# Patient Record
Sex: Male | Born: 1955 | Race: White | Hispanic: No | Marital: Single | Smoking: Never smoker
Health system: Southern US, Community
[De-identification: ages and names within clinical notes are randomized; demographics above are authoritative.]

## PROBLEM LIST (undated history)

## (undated) DIAGNOSIS — I10 Essential (primary) hypertension: Secondary | ICD-10-CM

## (undated) DIAGNOSIS — I4891 Unspecified atrial fibrillation: Secondary | ICD-10-CM

## (undated) DIAGNOSIS — E119 Type 2 diabetes mellitus without complications: Secondary | ICD-10-CM

## (undated) HISTORY — PX: NO PAST SURGERIES: SHX2092

---

## 2011-07-13 ENCOUNTER — Emergency Department: Payer: Self-pay | Admitting: General Practice

## 2013-12-04 ENCOUNTER — Other Ambulatory Visit: Payer: Self-pay | Admitting: Family Medicine

## 2013-12-04 LAB — COMPREHENSIVE METABOLIC PANEL
ALBUMIN: 4.4 g/dL (ref 3.4–5.0)
AST: 19 U/L (ref 15–37)
Alkaline Phosphatase: 80 U/L
Anion Gap: 11 (ref 7–16)
BILIRUBIN TOTAL: 0.7 mg/dL (ref 0.2–1.0)
BUN: 36 mg/dL — AB (ref 7–18)
CALCIUM: 10.5 mg/dL — AB (ref 8.5–10.1)
CO2: 26 mmol/L (ref 21–32)
Chloride: 96 mmol/L — ABNORMAL LOW (ref 98–107)
Creatinine: 1.7 mg/dL — ABNORMAL HIGH (ref 0.60–1.30)
EGFR (Non-African Amer.): 44 — ABNORMAL LOW
GFR CALC AF AMER: 51 — AB
Glucose: 220 mg/dL — ABNORMAL HIGH (ref 65–99)
Osmolality: 281 (ref 275–301)
Potassium: 3.9 mmol/L (ref 3.5–5.1)
SGPT (ALT): 33 U/L (ref 12–78)
SODIUM: 133 mmol/L — AB (ref 136–145)
Total Protein: 8.4 g/dL — ABNORMAL HIGH (ref 6.4–8.2)

## 2013-12-04 LAB — URINALYSIS, COMPLETE
BACTERIA: NONE SEEN
BLOOD: NEGATIVE
Bilirubin,UR: NEGATIVE
LEUKOCYTE ESTERASE: NEGATIVE
NITRITE: NEGATIVE
PH: 5 (ref 4.5–8.0)
Protein: NEGATIVE
RBC,UR: NONE SEEN /HPF (ref 0–5)
Specific Gravity: 1.026 (ref 1.003–1.030)
Squamous Epithelial: NONE SEEN

## 2013-12-04 LAB — MAGNESIUM: MAGNESIUM: 1.7 mg/dL — AB

## 2013-12-09 ENCOUNTER — Emergency Department: Payer: Self-pay | Admitting: Emergency Medicine

## 2013-12-09 LAB — URINALYSIS, COMPLETE
BACTERIA: NONE SEEN
BILIRUBIN, UR: NEGATIVE
Blood: NEGATIVE
Glucose,UR: >=500 mg/dL (ref 0–75)
LEUKOCYTE ESTERASE: NEGATIVE
Nitrite: NEGATIVE
PH: 5 (ref 4.5–8.0)
Protein: NEGATIVE
RBC,UR: NONE SEEN /HPF (ref 0–5)
Specific Gravity: 1.015 (ref 1.003–1.030)
Squamous Epithelial: NONE SEEN
WBC UR: 1 /HPF (ref 0–5)

## 2013-12-09 LAB — CBC
HCT: 39.9 % — ABNORMAL LOW (ref 40.0–52.0)
HGB: 13.3 g/dL (ref 13.0–18.0)
MCH: 29.2 pg (ref 26.0–34.0)
MCHC: 33.4 g/dL (ref 32.0–36.0)
MCV: 88 fL (ref 80–100)
PLATELETS: 240 10*3/uL (ref 150–440)
RBC: 4.56 10*6/uL (ref 4.40–5.90)
RDW: 13.6 % (ref 11.5–14.5)
WBC: 9.3 10*3/uL (ref 3.8–10.6)

## 2013-12-09 LAB — COMPREHENSIVE METABOLIC PANEL
ALBUMIN: 4.5 g/dL (ref 3.4–5.0)
ALT: 31 U/L (ref 12–78)
ANION GAP: 10 (ref 7–16)
Alkaline Phosphatase: 65 U/L
BUN: 47 mg/dL — AB (ref 7–18)
Bilirubin,Total: 0.4 mg/dL (ref 0.2–1.0)
CALCIUM: 9.3 mg/dL (ref 8.5–10.1)
Chloride: 101 mmol/L (ref 98–107)
Co2: 24 mmol/L (ref 21–32)
Creatinine: 1.81 mg/dL — ABNORMAL HIGH (ref 0.60–1.30)
EGFR (African American): 47 — ABNORMAL LOW
EGFR (Non-African Amer.): 41 — ABNORMAL LOW
GLUCOSE: 242 mg/dL — AB (ref 65–99)
Osmolality: 290 (ref 275–301)
POTASSIUM: 4 mmol/L (ref 3.5–5.1)
SGOT(AST): 20 U/L (ref 15–37)
Sodium: 135 mmol/L — ABNORMAL LOW (ref 136–145)
Total Protein: 7.9 g/dL (ref 6.4–8.2)

## 2013-12-10 LAB — BASIC METABOLIC PANEL
Anion Gap: 7 (ref 7–16)
BUN: 41 mg/dL — ABNORMAL HIGH (ref 7–18)
Calcium, Total: 8.3 mg/dL — ABNORMAL LOW (ref 8.5–10.1)
Chloride: 106 mmol/L (ref 98–107)
Co2: 22 mmol/L (ref 21–32)
Creatinine: 1.61 mg/dL — ABNORMAL HIGH (ref 0.60–1.30)
EGFR (African American): 54 — ABNORMAL LOW
GFR CALC NON AF AMER: 47 — AB
Glucose: 183 mg/dL — ABNORMAL HIGH (ref 65–99)
Osmolality: 285 (ref 275–301)
POTASSIUM: 4 mmol/L (ref 3.5–5.1)
Sodium: 135 mmol/L — ABNORMAL LOW (ref 136–145)

## 2013-12-17 ENCOUNTER — Emergency Department: Payer: Self-pay | Admitting: Emergency Medicine

## 2013-12-18 LAB — ETHANOL
ETHANOL %: 0.006 % (ref 0.000–0.080)
ETHANOL LVL: 6 mg/dL

## 2013-12-18 LAB — URINALYSIS, COMPLETE
Bacteria: NONE SEEN
Bilirubin,UR: NEGATIVE
Blood: NEGATIVE
Glucose,UR: >=500 mg/dL (ref 0–75)
Ketone: NEGATIVE
Leukocyte Esterase: NEGATIVE
NITRITE: NEGATIVE
Ph: 5 (ref 4.5–8.0)
Protein: NEGATIVE
Specific Gravity: 1.013 (ref 1.003–1.030)
Squamous Epithelial: NONE SEEN
WBC UR: <1 /HPF (ref 0–5)

## 2013-12-18 LAB — COMPREHENSIVE METABOLIC PANEL
ALBUMIN: 4.4 g/dL (ref 3.4–5.0)
ALT: 31 U/L (ref 12–78)
AST: 27 U/L (ref 15–37)
Alkaline Phosphatase: 59 U/L
Anion Gap: 10 (ref 7–16)
BILIRUBIN TOTAL: 0.5 mg/dL (ref 0.2–1.0)
BUN: 57 mg/dL — AB (ref 7–18)
CALCIUM: 9.5 mg/dL (ref 8.5–10.1)
Chloride: 99 mmol/L (ref 98–107)
Co2: 23 mmol/L (ref 21–32)
Creatinine: 2.12 mg/dL — ABNORMAL HIGH (ref 0.60–1.30)
EGFR (African American): 39 — ABNORMAL LOW
EGFR (Non-African Amer.): 34 — ABNORMAL LOW
GLUCOSE: 248 mg/dL — AB (ref 65–99)
OSMOLALITY: 289 (ref 275–301)
POTASSIUM: 3.8 mmol/L (ref 3.5–5.1)
Sodium: 132 mmol/L — ABNORMAL LOW (ref 136–145)
Total Protein: 8.1 g/dL (ref 6.4–8.2)

## 2013-12-18 LAB — CBC
HCT: 40.7 % (ref 40.0–52.0)
HGB: 13.6 g/dL (ref 13.0–18.0)
MCH: 30 pg (ref 26.0–34.0)
MCHC: 33.5 g/dL (ref 32.0–36.0)
MCV: 90 fL (ref 80–100)
Platelet: 247 10*3/uL (ref 150–440)
RBC: 4.54 10*6/uL (ref 4.40–5.90)
RDW: 14 % (ref 11.5–14.5)
WBC: 9 10*3/uL (ref 3.8–10.6)

## 2013-12-18 LAB — TROPONIN I: Troponin-I: <0.02 ng/mL

## 2013-12-21 ENCOUNTER — Ambulatory Visit: Payer: Self-pay | Admitting: Family Medicine

## 2013-12-30 ENCOUNTER — Ambulatory Visit: Payer: Self-pay | Admitting: Family Medicine

## 2014-01-18 ENCOUNTER — Ambulatory Visit: Payer: Self-pay | Admitting: Internal Medicine

## 2014-01-18 LAB — CBC CANCER CENTER
Basophil #: 0 x10 3/mm (ref 0.0–0.1)
Basophil %: 0.8 %
EOS ABS: 0.1 x10 3/mm (ref 0.0–0.7)
Eosinophil %: 1.5 %
HCT: 36.6 % — AB (ref 40.0–52.0)
HGB: 11.9 g/dL — ABNORMAL LOW (ref 13.0–18.0)
LYMPHS ABS: 1.3 x10 3/mm (ref 1.0–3.6)
LYMPHS PCT: 22.5 %
MCH: 29.4 pg (ref 26.0–34.0)
MCHC: 32.5 g/dL (ref 32.0–36.0)
MCV: 91 fL (ref 80–100)
MONOS PCT: 8.7 %
Monocyte #: 0.5 x10 3/mm (ref 0.2–1.0)
NEUTROS PCT: 66.5 %
Neutrophil #: 3.9 x10 3/mm (ref 1.4–6.5)
Platelet: 220 x10 3/mm (ref 150–440)
RBC: 4.05 10*6/uL — ABNORMAL LOW (ref 4.40–5.90)
RDW: 14.8 % — AB (ref 11.5–14.5)
WBC: 5.9 x10 3/mm (ref 3.8–10.6)

## 2014-01-18 LAB — HEPATIC FUNCTION PANEL A (ARMC)
ALBUMIN: 4 g/dL (ref 3.4–5.0)
Alkaline Phosphatase: 54 U/L
Bilirubin, Direct: 0.1 mg/dL (ref 0.00–0.20)
Bilirubin,Total: 0.7 mg/dL (ref 0.2–1.0)
SGOT(AST): 19 U/L (ref 15–37)
SGPT (ALT): 33 U/L (ref 12–78)
TOTAL PROTEIN: 7.4 g/dL (ref 6.4–8.2)

## 2014-01-18 LAB — CREATININE, SERUM
CREATININE: 1.32 mg/dL — AB (ref 0.60–1.30)
EGFR (African American): >60
GFR CALC NON AF AMER: 59 — AB

## 2014-01-18 LAB — URIC ACID: Uric Acid: 6.1 mg/dL (ref 3.5–7.2)

## 2014-01-18 LAB — LACTATE DEHYDROGENASE: LDH: 195 U/L (ref 85–241)

## 2014-01-19 LAB — KAPPA/LAMBDA FREE LIGHT CHAINS (ARMC)

## 2014-01-30 ENCOUNTER — Ambulatory Visit: Payer: Self-pay | Admitting: Family Medicine

## 2014-01-30 ENCOUNTER — Ambulatory Visit: Payer: Self-pay | Admitting: Internal Medicine

## 2014-03-02 ENCOUNTER — Ambulatory Visit: Payer: Self-pay | Admitting: Family Medicine

## 2014-09-07 DIAGNOSIS — E119 Type 2 diabetes mellitus without complications: Secondary | ICD-10-CM | POA: Insufficient documentation

## 2019-11-19 ENCOUNTER — Ambulatory Visit: Payer: Self-pay | Admitting: Internal Medicine

## 2019-11-22 ENCOUNTER — Other Ambulatory Visit: Payer: Self-pay | Admitting: Internal Medicine

## 2019-11-27 ENCOUNTER — Telehealth: Payer: Self-pay | Admitting: Family Medicine

## 2019-11-27 ENCOUNTER — Other Ambulatory Visit: Payer: Self-pay | Admitting: Internal Medicine

## 2019-11-27 NOTE — Telephone Encounter (Signed)
Patient called and nurse had already finished it

## 2020-02-10 ENCOUNTER — Other Ambulatory Visit: Payer: Self-pay

## 2020-02-10 ENCOUNTER — Encounter: Payer: Self-pay | Admitting: Internal Medicine

## 2020-02-10 ENCOUNTER — Ambulatory Visit (INDEPENDENT_AMBULATORY_CARE_PROVIDER_SITE_OTHER): Payer: Self-pay | Admitting: Internal Medicine

## 2020-02-10 VITALS — BP 121/73 | HR 98 | Ht 73.0 in | Wt 255.2 lb

## 2020-02-10 DIAGNOSIS — E669 Obesity, unspecified: Secondary | ICD-10-CM

## 2020-02-10 DIAGNOSIS — E139 Other specified diabetes mellitus without complications: Secondary | ICD-10-CM

## 2020-02-10 DIAGNOSIS — I119 Hypertensive heart disease without heart failure: Secondary | ICD-10-CM

## 2020-02-10 DIAGNOSIS — I4891 Unspecified atrial fibrillation: Secondary | ICD-10-CM

## 2020-02-10 HISTORY — DX: Obesity, unspecified: E66.9

## 2020-02-10 LAB — POCT INR: INR: 2.6 (ref 2.0–3.0)

## 2020-02-10 MED ORDER — GLIPIZIDE ER 10 MG PO TB24: 10.0000 mg | ORAL_TABLET | Freq: Every day | ORAL | 3 refills | Status: DC

## 2020-02-10 MED ORDER — OLMESARTAN MEDOXOMIL 40 MG PO TABS: 40.0000 mg | ORAL_TABLET | Freq: Every day | ORAL | 2 refills | Status: DC

## 2020-02-10 MED ORDER — MUPIROCIN 2 % EX OINT: 1.0000 "application " | TOPICAL_OINTMENT | Freq: Two times a day (BID) | CUTANEOUS | 4 refills | Status: DC

## 2020-02-10 NOTE — Progress Notes (Signed)
Established Patient Office Visit  SUBJECTIVE:  Subjective  Patient ID: Jesse Humphrey, male    DOB: 07-05-1955  Age: 64 y.o. MRN: [id number]  CC:  Chief Complaint  Patient presents with  . Coumadin Management    Patient is here today for his 1 month INR check    HPI Jesse Humphrey is a 64 y.o. male presenting today for his 1 month INR check.  His INR today is 2.6.   He is not currently vaccinated against COVID-19. He is unsure about whether or not he wants to be vaccinated.   History reviewed. No pertinent past medical history.  History reviewed. No pertinent surgical history.  History reviewed. No pertinent family history.  Social History   Socioeconomic History  . Marital status: Single    Spouse name: Not on file  . Number of children: Not on file  . Years of education: Not on file  . Highest education level: Not on file  Occupational History  . Not on file  Tobacco Use  . Smoking status: Never Smoker  . Smokeless tobacco: Never Used  Substance and Sexual Activity  . Alcohol use: Never  . Drug use: Never  . Sexual activity: Not on file  Other Topics Concern  . Not on file  Social History Narrative  . Not on file   Social Determinants of Health   Financial Resource Strain:   . Difficulty of Paying Living Expenses:   Food Insecurity:   . Worried About Programme researcher, broadcasting/film/video in the Last Year:   . Barista in the Last Year:   Transportation Needs:   . Freight forwarder (Medical):   Marland Kitchen Lack of Transportation (Non-Medical):   Physical Activity:   . Days of Exercise per Week:   . Minutes of Exercise per Session:   Stress:   . Feeling of Stress :   Social Connections:   . Frequency of Communication with Friends and Family:   . Frequency of Social Gatherings with Friends and Family:   . Attends Religious Services:   . Active Member of Clubs or Organizations:   . Attends Banker Meetings:   Marland Kitchen Marital Status:   Intimate Partner  Violence:   . Fear of Current or Ex-Partner:   . Emotionally Abused:   Marland Kitchen Physically Abused:   . Sexually Abused:      Current Outpatient Medications:  .  cloNIDine (CATAPRES) 0.3 MG tablet, Take 0.3 mg by mouth 2 (two) times daily., Disp: , Rfl:  .  doxazosin (CARDURA) 4 MG tablet, TAKE ONE TABLET BY MOUTH DAILY, Disp: 30 tablet, Rfl: 4 .  furosemide (LASIX) 20 MG tablet, Take 20 mg by mouth., Disp: , Rfl:  .  glipiZIDE (GLUCOTROL XL) 10 MG 24 hr tablet, Take 1 tablet (10 mg total) by mouth daily., Disp: 90 tablet, Rfl: 3 .  loratadine (CLARITIN) 10 MG tablet, Take 10 mg by mouth daily., Disp: , Rfl:  .  mupirocin ointment (BACTROBAN) 2 %, Apply 1 application topically 2 (two) times daily., Disp: 22 g, Rfl: 4 .  olmesartan (BENICAR) 40 MG tablet, Take 1 tablet (40 mg total) by mouth daily., Disp: 90 tablet, Rfl: 2 .  pioglitazone (ACTOS) 45 MG tablet, Take 45 mg by mouth daily., Disp: , Rfl:  .  SOTALOL AF 80 MG TABS, TAKE FOUR TABLETS BY MOUTH DAILY, Disp: 120 tablet, Rfl: 4 .  warfarin (COUMADIN) 5 MG tablet, Take 5 mg by mouth daily., Disp: ,  Rfl:    No Known Allergies  ROS Review of Systems  Constitutional: Negative.   HENT: Negative.   Eyes: Negative.   Respiratory: Negative.   Cardiovascular: Negative.   Gastrointestinal: Negative.   Endocrine: Negative.   Genitourinary: Negative.   Musculoskeletal: Negative.   Skin: Negative.   Allergic/Immunologic: Negative.   Neurological: Negative.   Hematological: Negative.   Psychiatric/Behavioral: Negative.   All other systems reviewed and are negative.    OBJECTIVE:    Physical Exam Vitals reviewed.  Constitutional:      Appearance: Normal appearance.  HENT:     Mouth/Throat:     Mouth: Mucous membranes are moist.  Eyes:     Pupils: Pupils are equal, round, and reactive to light.  Neck:     Vascular: No carotid bruit.  Cardiovascular:     Rate and Rhythm: Normal rate and regular rhythm.     Pulses: Normal pulses.       Heart sounds: Normal heart sounds.  Pulmonary:     Effort: Pulmonary effort is normal.     Breath sounds: Normal breath sounds.  Abdominal:     General: Bowel sounds are normal.     Palpations: Abdomen is soft. There is no hepatomegaly, splenomegaly or mass.     Tenderness: There is no abdominal tenderness.     Hernia: No hernia is present.  Musculoskeletal:     Cervical back: Neck supple.     Right lower leg: No edema.     Left lower leg: No edema.  Skin:    Findings: No rash.  Neurological:     Mental Status: He is alert and oriented to person, place, and time.     Motor: No weakness.  Psychiatric:        Mood and Affect: Mood normal.        Behavior: Behavior normal.     BP 121/73   Pulse 98   Ht 6\' 1"  (1.854 m)   Wt 255 lb 3.2 oz (115.8 kg)   BMI 33.67 kg/m  Wt Readings from Last 3 Encounters:  02/10/20 255 lb 3.2 oz (115.8 kg)    Health Maintenance Due  Topic Date Due  . HEMOGLOBIN A1C  Never done  . Hepatitis C Screening  Never done  . PNEUMOCOCCAL POLYSACCHARIDE VACCINE AGE 76-64 HIGH RISK  Never done  . FOOT EXAM  Never done  . OPHTHALMOLOGY EXAM  Never done  . COVID-19 Vaccine (1) Never done  . HIV Screening  Never done  . TETANUS/TDAP  Never done  . COLONOSCOPY  Never done  . INFLUENZA VACCINE  01/31/2020    There are no preventive care reminders to display for this patient.  CBC Latest Ref Rng & Units 01/18/2014 12/18/2013 12/09/2013  WBC 3.8 - 10.6 x10 3/mm  5.9 9.0 9.3  Hemoglobin 13.0 - 18.0 g/dL 11.9(L) 13.6 13.3  Hematocrit 40.0 - 52.0 % 36.6(L) 40.7 39.9(L)  Platelets 150 - 440 x10 3/mm  220 247 240   CMP Latest Ref Rng & Units 01/18/2014 12/18/2013 12/10/2013  Glucose 65 - 99 mg/dL - 161(W248(H) 960(A183(H)  BUN 7 - 18 mg/dL - 54(U57(H) 98(J41(H)  Creatinine 0.60 - 1.30 mg/dL 1.91(Y1.32(H) 7.82(N2.12(H) 5.62(Z1.61(H)  Sodium 136 - 145 mmol/L - 132(L) 135(L)  Potassium 3.5 - 5.1 mmol/L - 3.8 4.0  Chloride 98 - 107 mmol/L - 99 106  CO2 21 - 32 mmol/L - 23 22  Calcium 8.5 -  10.1 mg/dL - 9.5 3.0(Q8.3(L)  Total Protein  6.4 - 8.2 g/dL 7.4 8.1 -  Total Bilirubin 0.2 - 1.0 mg/dL 0.7 0.5 -  Alkaline Phos Unit/L 54 59 -  AST 15 - 37 Unit/L 19 27 -  ALT 12 - 78 U/L 33 31 -    No results found for: TSH Lab Results  Component Value Date   ALBUMIN 4.0 01/18/2014   ANIONGAP 10 12/18/2013   No results found for: CHOL, HDL, LDLCALC, CHOLHDL No results found for: TRIG No results found for: HGBA1C    ASSESSMENT & PLAN:   Problem List Items Addressed This Visit      Cardiovascular and Mediastinum   Hypertensive heart disease    - Today, the patient's blood pressure is well managed on benicar. - The patient will continue the current treatment regimen.  - I encouraged the patient to eat a low-sodium diet to help control blood pressure. - I encouraged the patient to live an active lifestyle and complete activities that increases heart rate to 85% target heart rate at least 5 times per week for one hour.          Relevant Medications   warfarin (COUMADIN) 5 MG tablet   furosemide (LASIX) 20 MG tablet   cloNIDine (CATAPRES) 0.3 MG tablet   olmesartan (BENICAR) 40 MG tablet   Atrial fibrillation (HCC) - Primary    Patient pro time is therapeutic.  He is taking Coumadin 5 mg p.o. daily.  Also takes sotalol 80 mg 1 tablet twice a day.      Relevant Medications   warfarin (COUMADIN) 5 MG tablet   furosemide (LASIX) 20 MG tablet   cloNIDine (CATAPRES) 0.3 MG tablet   olmesartan (BENICAR) 40 MG tablet   Other Relevant Orders   POCT INR (Completed)     Endocrine   Diabetes 1.5, managed as type 2 (HCC)    Diabetes is being managed with pioglitazone he was advised to have a physical eye exam and colonoscopy.      Relevant Medications   pioglitazone (ACTOS) 45 MG tablet   glipiZIDE (GLUCOTROL XL) 10 MG 24 hr tablet   olmesartan (BENICAR) 40 MG tablet     Other   Obesity (BMI 30-39.9)    - I encouraged the patient to lose weight.  - I educated them on making  healthy dietary choices including eating more fruits and vegetables and less fried foods. - I encouraged the patient to exercise more, and educated on the benefits of exercise including weight loss, diabetes management, and hypertension management.        Relevant Medications   pioglitazone (ACTOS) 45 MG tablet   glipiZIDE (GLUCOTROL XL) 10 MG 24 hr tablet      Meds ordered this encounter  Medications  . mupirocin ointment (BACTROBAN) 2 %    Sig: Apply 1 application topically 2 (two) times daily.    Dispense:  22 g    Refill:  4  . glipiZIDE (GLUCOTROL XL) 10 MG 24 hr tablet    Sig: Take 1 tablet (10 mg total) by mouth daily.    Dispense:  90 tablet    Refill:  3  . olmesartan (BENICAR) 40 MG tablet    Sig: Take 1 tablet (40 mg total) by mouth daily.    Dispense:  90 tablet    Refill:  2    Follow-up: No follow-ups on file.    Dr. Woodroe Chen [facility] [address], [place], [place] [address]   By signing my  name below, I, [employer], attest that this documentation has been prepared under the direction and in the presence of Corky Downs, MD. Electronically Signed: Corky Downs, MD 02/10/20, 7:45 PM   I personally performed the services described in this documentation, which was SCRIBED in my presence. The recorded information has been reviewed and considered accurate. It has been edited as necessary during review. Corky Downs, MD

## 2020-02-10 NOTE — Assessment & Plan Note (Signed)
Diabetes is being managed with pioglitazone he was advised to have a physical eye exam and colonoscopy.

## 2020-02-10 NOTE — Assessment & Plan Note (Signed)
Patient pro time is therapeutic.  He is taking Coumadin 5 mg p.o. daily.  Also takes sotalol 80 mg 1 tablet twice a day.

## 2020-02-10 NOTE — Patient Instructions (Addendum)
COVID-19  COVID-19 is a respiratory infection that is caused by a virus called severe acute respiratory syndrome coronavirus 2 (SARS-CoV-2). The disease is also known as coronavirus disease or novel coronavirus. In some people, the virus may not cause any symptoms. In others, it may cause a serious infection. The infection can get worse quickly and can lead to complications, such as:  Pneumonia, or infection of the lungs.  Acute respiratory distress syndrome or ARDS. This is a condition in which fluid build-up in the lungs prevents the lungs from filling with air and passing oxygen into the blood.  Acute respiratory failure. This is a condition in which there is not enough oxygen passing from the lungs to the body or when carbon dioxide is not passing from the lungs out of the body.  Sepsis or septic shock. This is a serious bodily reaction to an infection.  Blood clotting problems.  Secondary infections due to bacteria or fungus.  Organ failure. This is when your body's organs stop working. The virus that causes COVID-19 is contagious. This means that it can spread from person to person through droplets from coughs and sneezes (respiratory secretions).  What are the causes? This illness is caused by a virus. You may catch the virus by:  Breathing in droplets from an infected person. Droplets can be spread by a person breathing, speaking, singing, coughing, or sneezing.  Touching something, like a table or a doorknob, that was exposed to the virus (contaminated) and then touching your mouth, nose, or eyes.  What increases the risk? Risk for infection You are more likely to be infected with this virus if you:  Are within 6 feet (2 meters) of a person with COVID-19.  Provide care for or live with a person who is infected with COVID-19.  Spend time in crowded indoor spaces or live in shared housing.  Risk for serious illness You are more likely to become seriously ill from the virus  if you: 1. Are 64 years of age or older. The higher your age, the more you are at risk for serious illness. 2. Live in a nursing home or long-term care facility. 3. Have cancer. 4. Have a long-term (chronic) disease such as: ? Chronic lung disease, including chronic obstructive pulmonary disease or asthma. ? A long-term disease that lowers your body's ability to fight infection (immunocompromised). ? Heart disease, including heart failure, a condition in which the arteries that lead to the heart become narrow or blocked (coronary artery disease), a disease which makes the heart muscle thick, weak, or stiff (cardiomyopathy). ? Diabetes. ? Chronic kidney disease. ? Sickle cell disease, a condition in which red blood cells have an abnormal "sickle" shape. ? Liver disease. 5. Are obese.  What are the signs or symptoms? Symptoms of this condition can range from mild to severe. Symptoms may appear any time from 2 to 14 days after being exposed to the virus. They include:  A fever or chills.  A cough.  Difficulty breathing.  Headaches, body aches, or muscle aches.  Runny or stuffy (congested) nose.  A sore throat.  New loss of taste or smell. Some people may also have stomach problems, such as nausea, vomiting, or diarrhea. Other people may not have any symptoms of COVID-19.  How is this diagnosed? This condition may be diagnosed based on: 1. Your signs and symptoms, especially if: ? You live in an area with a COVID-19 outbreak. ? You recently traveled to or from an area where  the virus is common. ? You provide care for or live with a person who was diagnosed with COVID-19. ? You were exposed to a person who was diagnosed with COVID-19. 2. A physical exam. 3. Lab tests, which may include: ? Taking a sample of fluid from the back of your nose and throat (nasopharyngeal fluid), your nose, or your throat using a swab. ? A sample of mucus from your lungs (sputum). ? Blood  tests. 4. Imaging tests, which may include, X-rays, CT scan, or ultrasound.  How is this treated? Your health care provider will talk with you about ways to treat your symptoms. For most people, the infection is mild and can be managed at home with rest, fluids, and over-the-counter medicines. Treatment for a serious infection usually takes places in a hospital intensive care unit (ICU). It may include one or more of the following treatments. These treatments are given until your symptoms improve.  Receiving fluids and medicines through an IV.  Supplemental oxygen. Extra oxygen is given through a tube in the nose, a face mask, or a hood.  Positioning you to lie on your stomach (prone position). This makes it easier for oxygen to get into the lungs.  Continuous positive airway pressure (CPAP) or bi-level positive airway pressure (BPAP) machine. This treatment uses mild air pressure to keep the airways open. A tube that is connected to a motor delivers oxygen to the body.  Ventilator. This treatment moves air into and out of the lungs by using a tube that is placed in your windpipe.  Tracheostomy. This is a procedure to create a hole in the neck so that a breathing tube can be inserted.  Extracorporeal membrane oxygenation (ECMO). This procedure gives the lungs a chance to recover by taking over the functions of the heart and lungs. It supplies oxygen to the body and removes carbon dioxide.  Follow these instructions at home: Lifestyle  If you are sick, stay home except to get medical care. Your health care provider will tell you how long to stay home. Call your health care provider before you go for medical care.  Rest at home as told by your health care provider.  Do not use any products that contain nicotine or tobacco, such as cigarettes, e-cigarettes, and chewing tobacco. If you need help quitting, ask your health care provider.  Return to your normal activities as told by your  health care provider. Ask your health care provider what activities are safe for you.  General instructions  Take over-the-counter and prescription medicines only as told by your health care provider.  Drink enough fluid to keep your urine pale yellow.  Keep all follow-up visits as told by your health care provider. This is important.  How is this prevented?   To protect yourself:  1. Do not travel to areas where COVID-19 is a risk. The areas where COVID-19 is reported change often. To identify high-risk areas and travel restrictions, check the CDC travel website: StageSync.si 2. If you live in, or must travel to, an area where COVID-19 is a risk, take precautions to avoid infection. 1. Stay away from people who are sick. 2. Wash your hands often with soap and water for 20 seconds. If soap and water are not available, use an alcohol-based hand sanitizer. 3. Avoid touching your mouth, face, eyes, or nose. 4. Avoid going out in public, follow guidance from your state and local health authorities. 5. If you must go out in public, wear a  cloth face covering or face mask. Make sure your mask covers your nose and mouth. 6. Avoid crowded indoor spaces. Stay at least 6 feet (2 meters) away from others. 7. Disinfect objects and surfaces that are frequently touched every day. This may include:  Counters and tables.  Doorknobs and light switches.  Sinks and faucets.  Electronics, such as phones, remote controls, keyboards, computers, and tablets.  To protect others: If you have symptoms of COVID-19, take steps to prevent the virus from spreading to others.  If you think you have a COVID-19 infection, contact your health care provider right away. Tell your health care team that you think you may have a COVID-19 infection.  Stay home. Leave your house only to seek medical care. Do not use public transport.  Do not travel while you are sick.  Wash your hands often with soap  and water for 20 seconds. If soap and water are not available, use alcohol-based hand sanitizer.  Stay away from other members of your household. Let healthy household members care for children and pets, if possible. If you have to care for children or pets, wash your hands often and wear a mask. If possible, stay in your own room, separate from others. Use a different bathroom.  Make sure that all people in your household wash their hands well and often.  Cough or sneeze into a tissue or your sleeve or elbow. Do not cough or sneeze into your hand or into the air.  Wear a cloth face covering or face mask. Make sure your mask covers your nose and mouth.  Where to find more information  Centers for Disease Control and Prevention: StickerEmporium.tn  World Health Organization: https://thompson-craig.com/  Contact a health care provider if:  You live in or have traveled to an area where COVID-19 is a risk and you have symptoms of the infection.  You have had contact with someone who has COVID-19 and you have symptoms of the infection.  Get help right away if:  You have trouble breathing.  You have pain or pressure in your chest.  You have confusion.  You have bluish lips and fingernails.  You have difficulty waking from sleep.  You have symptoms that get worse.  These symptoms may represent a serious problem that is an emergency. Do not wait to see if the symptoms will go away. Get medical help right away. Call your local emergency services (911 in the [place] Do not drive yourself to the hospital. Let the emergency medical personnel know if you think you have COVID-19. Summary  COVID-19 is a respiratory infection that is caused by a virus. It is also known as coronavirus disease or novel coronavirus. It can cause serious infections, such as pneumonia, acute respiratory distress syndrome, acute respiratory failure, or sepsis.  The virus that  causes COVID-19 is contagious. This means that it can spread from person to person through droplets from breathing, speaking, singing, coughing, or sneezing.  You are more likely to develop a serious illness if you are 48 years of age or older, have a weak immune system, live in a nursing home, or have chronic disease.  There is no medicine to treat COVID-19. Your health care provider will talk with you about ways to treat your symptoms.  Take steps to protect yourself and others from infection. Wash your hands often and disinfect objects and surfaces that are frequently touched every day. Stay away from people who are sick and wear a mask if  you are sick.  This information is not intended to replace advice given to you by your health care provider. Make sure you discuss any questions you have with your health care provider.  Document Revised: 04/17/2019 Document Reviewed: 07/24/2018 Elsevier Patient Education  2020 [employer].

## 2020-02-10 NOTE — Assessment & Plan Note (Signed)
-   Today, the patient's blood pressure is well managed on benicar. - The patient will continue the current treatment regimen.  - I encouraged the patient to eat a low-sodium diet to help control blood pressure. - I encouraged the patient to live an active lifestyle and complete activities that increases heart rate to 85% target heart rate at least 5 times per week for one hour.     

## 2020-02-10 NOTE — Assessment & Plan Note (Signed)
-   I encouraged the patient to lose weight.  - I educated them on making healthy dietary choices including eating more fruits and vegetables and less fried foods. - I encouraged the patient to exercise more, and educated on the benefits of exercise including weight loss, diabetes management, and hypertension management.   

## 2020-02-29 ENCOUNTER — Other Ambulatory Visit: Payer: Self-pay | Admitting: *Deleted

## 2020-02-29 MED ORDER — DOXAZOSIN MESYLATE 4 MG PO TABS: 4.0000 mg | ORAL_TABLET | Freq: Every day | ORAL | 4 refills | Status: DC

## 2020-03-09 ENCOUNTER — Other Ambulatory Visit: Payer: Self-pay

## 2020-03-09 MED ORDER — GLIPIZIDE ER 10 MG PO TB24
10.0000 mg | ORAL_TABLET | Freq: Every day | ORAL | 3 refills | Status: DC
Start: 2020-03-09 — End: 2021-03-07

## 2020-04-29 ENCOUNTER — Other Ambulatory Visit: Payer: Self-pay | Admitting: Internal Medicine

## 2020-05-02 ENCOUNTER — Other Ambulatory Visit: Payer: Self-pay | Admitting: *Deleted

## 2020-05-02 MED ORDER — SOTALOL HCL (AF) 80 MG PO TABS: 4.0000 | ORAL_TABLET | Freq: Every day | ORAL | 4 refills | Status: DC

## 2020-05-11 ENCOUNTER — Ambulatory Visit (INDEPENDENT_AMBULATORY_CARE_PROVIDER_SITE_OTHER): Payer: Self-pay | Admitting: Family Medicine

## 2020-05-11 ENCOUNTER — Encounter: Payer: Self-pay | Admitting: Family Medicine

## 2020-05-11 ENCOUNTER — Other Ambulatory Visit: Payer: Self-pay

## 2020-05-11 VITALS — BP 189/102 | HR 108 | Ht 72.0 in | Wt 265.5 lb

## 2020-05-11 DIAGNOSIS — E669 Obesity, unspecified: Secondary | ICD-10-CM

## 2020-05-11 DIAGNOSIS — I4891 Unspecified atrial fibrillation: Secondary | ICD-10-CM

## 2020-05-11 DIAGNOSIS — I119 Hypertensive heart disease without heart failure: Secondary | ICD-10-CM

## 2020-05-11 DIAGNOSIS — E139 Other specified diabetes mellitus without complications: Secondary | ICD-10-CM

## 2020-05-11 LAB — POCT INR: INR: 2.7 (ref 2.0–3.0)

## 2020-05-11 MED ORDER — PIOGLITAZONE HCL 45 MG PO TABS
45.0000 mg | ORAL_TABLET | Freq: Every day | ORAL | 3 refills | Status: DC
Start: 2020-05-11 — End: 2021-06-02

## 2020-05-11 MED ORDER — OLMESARTAN MEDOXOMIL 40 MG PO TABS
40.0000 mg | ORAL_TABLET | Freq: Every day | ORAL | 3 refills | Status: DC
Start: 2020-05-11 — End: 2021-06-02

## 2020-05-11 NOTE — Assessment & Plan Note (Signed)
HR wnl today, Regular Rate and Rhythm, taking Coumadin as rx, INR wnl.

## 2020-05-11 NOTE — Assessment & Plan Note (Signed)
Not at goal today, has not been under control for sometime, medication compliant and Jesse Humphrey wants to try and lose weight before we start any other medication. Will f/u in 3 months.

## 2020-05-11 NOTE — Assessment & Plan Note (Signed)
No at goal, discussed low carb diet and portion control with moderate exercise.

## 2020-05-11 NOTE — Assessment & Plan Note (Signed)
DM well controlled all meds being taken as rx. Will fu with labs in 3 months.

## 2020-05-11 NOTE — Progress Notes (Signed)
Established Patient Office Visit  SUBJECTIVE:  Subjective  Patient ID: Jesse Humphrey, male    DOB: [birth year]  Age: 64 y.o. MRN: [id number]  CC:  Chief Complaint  Patient presents with  . Atrial Fibrillation    HPI Jesse Humphrey is a 64 y.o. male presenting today for HTN and DM f/u  History reviewed. No pertinent past medical history.  History reviewed. No pertinent surgical history.  History reviewed. No pertinent family history.  Social History   Socioeconomic History  . Marital status: Single    Spouse name: Not on file  . Number of children: Not on file  . Years of education: Not on file  . Highest education level: Not on file  Occupational History  . Not on file  Tobacco Use  . Smoking status: Never Smoker  . Smokeless tobacco: Never Used  Substance and Sexual Activity  . Alcohol use: Never  . Drug use: Never  . Sexual activity: Not on file  Other Topics Concern  . Not on file  Social History Narrative  . Not on file   Social Determinants of Health   Financial Resource Strain:   . Difficulty of Paying Living Expenses: Not on file  Food Insecurity:   . Worried About Programme researcher, broadcasting/film/video in the Last Year: Not on file  . Ran Out of Food in the Last Year: Not on file  Transportation Needs:   . Lack of Transportation (Medical): Not on file  . Lack of Transportation (Non-Medical): Not on file  Physical Activity:   . Days of Exercise per Week: Not on file  . Minutes of Exercise per Session: Not on file  Stress:   . Feeling of Stress : Not on file  Social Connections:   . Frequency of Communication with Friends and Family: Not on file  . Frequency of Social Gatherings with Friends and Family: Not on file  . Attends Religious Services: Not on file  . Active Member of Clubs or Organizations: Not on file  . Attends Banker Meetings: Not on file  . Marital Status: Not on file  Intimate Partner Violence:   . Fear of Current or Ex-Partner: Not  on file  . Emotionally Abused: Not on file  . Physically Abused: Not on file  . Sexually Abused: Not on file     Current Outpatient Medications:  .  cloNIDine (CATAPRES) 0.3 MG tablet, Take 0.3 mg by mouth 2 (two) times daily., Disp: , Rfl:  .  doxazosin (CARDURA) 4 MG tablet, Take 1 tablet (4 mg total) by mouth daily., Disp: 30 tablet, Rfl: 4 .  furosemide (LASIX) 20 MG tablet, Take 20 mg by mouth., Disp: , Rfl:  .  glipiZIDE (GLUCOTROL XL) 10 MG 24 hr tablet, Take 1 tablet (10 mg total) by mouth daily., Disp: 90 tablet, Rfl: 3 .  loratadine (CLARITIN) 10 MG tablet, Take 10 mg by mouth daily., Disp: , Rfl:  .  mupirocin ointment (BACTROBAN) 2 %, Apply 1 application topically 2 (two) times daily., Disp: 22 g, Rfl: 4 .  olmesartan (BENICAR) 40 MG tablet, Take 1 tablet (40 mg total) by mouth daily., Disp: 90 tablet, Rfl: 3 .  pioglitazone (ACTOS) 45 MG tablet, Take 1 tablet (45 mg total) by mouth daily., Disp: 90 tablet, Rfl: 3 .  sotalol (BETAPACE) 80 MG tablet, TAKE FOUR TABLETS BY MOUTH DAILY, Disp: 120 tablet, Rfl: 3 .  SOTALOL AF 80 MG TABS, Take 4 tablets (320 mg total) by  mouth daily., Disp: 120 tablet, Rfl: 4 .  warfarin (COUMADIN) 5 MG tablet, Take 5 mg by mouth daily., Disp: , Rfl:    No Known Allergies  ROS Review of Systems   OBJECTIVE:    Physical Exam  BP (!) 189/102   Pulse (!) 108   Ht 6' (1.829 m)   Wt 265 lb 8 oz (120.4 kg)   BMI 36.01 kg/m  Wt Readings from Last 3 Encounters:  05/11/20 265 lb 8 oz (120.4 kg)  02/10/20 255 lb 3.2 oz (115.8 kg)    Health Maintenance Due  Topic Date Due  . HEMOGLOBIN A1C  Never done  . Hepatitis C Screening  Never done  . PNEUMOCOCCAL POLYSACCHARIDE VACCINE AGE 83-64 HIGH RISK  Never done  . FOOT EXAM  Never done  . OPHTHALMOLOGY EXAM  Never done  . COVID-19 Vaccine (1) Never done  . HIV Screening  Never done  . TETANUS/TDAP  Never done  . COLONOSCOPY  Never done  . INFLUENZA VACCINE  Never done    There are no  preventive care reminders to display for this patient.  CBC Latest Ref Rng & Units 01/18/2014 12/18/2013 12/09/2013  WBC 3.8 - 10.6 x10 3/mm  5.9 9.0 9.3  Hemoglobin 13.0 - 18.0 g/dL 11.9(L) 13.6 13.3  Hematocrit 40.0 - 52.0 % 36.6(L) 40.7 39.9(L)  Platelets 150 - 440 x10 3/mm  220 247 240   CMP Latest Ref Rng & Units 01/18/2014 12/18/2013 12/10/2013  Glucose 65 - 99 mg/dL - 993(Z) 169(C)  BUN 7 - 18 mg/dL - 78(L) 38(B)  Creatinine 0.60 - 1.30 mg/dL 0.17(P) 1.02(H) 8.52(D)  Sodium 136 - 145 mmol/L - 132(L) 135(L)  Potassium 3.5 - 5.1 mmol/L - 3.8 4.0  Chloride 98 - 107 mmol/L - 99 106  CO2 21 - 32 mmol/L - 23 22  Calcium 8.5 - 10.1 mg/dL - 9.5 7.8(E)  Total Protein 6.4 - 8.2 g/dL 7.4 8.1 -  Total Bilirubin 0.2 - 1.0 mg/dL 0.7 0.5 -  Alkaline Phos Unit/L 54 59 -  AST 15 - 37 Unit/L 19 27 -  ALT 12 - 78 U/L 33 31 -    No results found for: TSH Lab Results  Component Value Date   ALBUMIN 4.0 01/18/2014   ANIONGAP 10 12/18/2013   No results found for: CHOL, HDL, LDLCALC, CHOLHDL No results found for: TRIG No results found for: HGBA1C    ASSESSMENT & PLAN:   Problem List Items Addressed This Visit      Cardiovascular and Mediastinum   Hypertensive heart disease    Not at goal today, has not been under control for sometime, medication compliant and Mr Alred wants to try and lose weight before we start any other medication. Will f/u in 3 months.       Relevant Medications   olmesartan (BENICAR) 40 MG tablet   Atrial fibrillation (HCC) - Primary    HR wnl today, Regular Rate and Rhythm, taking Coumadin as rx, INR wnl.      Relevant Medications   olmesartan (BENICAR) 40 MG tablet   Other Relevant Orders   POCT INR (Completed)     Endocrine   Diabetes 1.5, managed as type 2 (HCC)    DM well controlled all meds being taken as rx. Will fu with labs in 3 months.       Relevant Medications   pioglitazone (ACTOS) 45 MG tablet   olmesartan (BENICAR) 40 MG tablet     Other  Obesity (BMI 30-39.9)    No at goal, discussed low carb diet and portion control with moderate exercise.       Relevant Medications   pioglitazone (ACTOS) 45 MG tablet      Meds ordered this encounter  Medications  . pioglitazone (ACTOS) 45 MG tablet    Sig: Take 1 tablet (45 mg total) by mouth daily.    Dispense:  90 tablet    Refill:  3  . olmesartan (BENICAR) 40 MG tablet    Sig: Take 1 tablet (40 mg total) by mouth daily.    Dispense:  90 tablet    Refill:  3      Follow-up: No follow-ups on file.    Irish Lack, FNP [facility] [address], [place], [place] [address]

## 2020-07-19 ENCOUNTER — Ambulatory Visit (INDEPENDENT_AMBULATORY_CARE_PROVIDER_SITE_OTHER): Payer: Self-pay | Admitting: Internal Medicine

## 2020-07-19 ENCOUNTER — Other Ambulatory Visit: Payer: Self-pay

## 2020-07-19 DIAGNOSIS — E1169 Type 2 diabetes mellitus with other specified complication: Secondary | ICD-10-CM

## 2020-07-19 NOTE — Progress Notes (Signed)
po

## 2020-07-20 ENCOUNTER — Other Ambulatory Visit (INDEPENDENT_AMBULATORY_CARE_PROVIDER_SITE_OTHER): Payer: Self-pay

## 2020-07-20 ENCOUNTER — Other Ambulatory Visit: Payer: Self-pay

## 2020-07-20 DIAGNOSIS — E669 Obesity, unspecified: Secondary | ICD-10-CM

## 2020-07-21 LAB — CBC WITH DIFFERENTIAL/PLATELET
Absolute Monocytes: 340 cells/uL (ref 200–950)
Basophils Absolute: 39 cells/uL (ref 0–200)
Basophils Relative: 0.9 %
Eosinophils Absolute: 90 cells/uL (ref 15–500)
Eosinophils Relative: 2.1 %
HCT: 38.2 % — ABNORMAL LOW (ref 38.5–50.0)
Hemoglobin: 13.1 g/dL — ABNORMAL LOW (ref 13.2–17.1)
Lymphs Abs: 1028 cells/uL (ref 850–3900)
MCH: 30.1 pg (ref 27.0–33.0)
MCHC: 34.3 g/dL (ref 32.0–36.0)
MCV: 87.8 fL (ref 80.0–100.0)
MPV: 11.3 fL (ref 7.5–12.5)
Monocytes Relative: 7.9 %
Neutro Abs: 2804 cells/uL (ref 1500–7800)
Neutrophils Relative %: 65.2 %
Platelets: 168 10*3/uL (ref 140–400)
RBC: 4.35 10*6/uL (ref 4.20–5.80)
RDW: 13.2 % (ref 11.0–15.0)
Total Lymphocyte: 23.9 %
WBC: 4.3 10*3/uL (ref 3.8–10.8)

## 2020-07-21 LAB — COMPLETE METABOLIC PANEL WITH GFR
AG Ratio: 1.5 (calc) (ref 1.0–2.5)
ALT: 13 U/L (ref 9–46)
AST: 18 U/L (ref 10–35)
Albumin: 4.4 g/dL (ref 3.6–5.1)
Alkaline phosphatase (APISO): 77 U/L (ref 35–144)
BUN/Creatinine Ratio: 7 (calc) (ref 6–22)
BUN: 10 mg/dL (ref 7–25)
CO2: 28 mmol/L (ref 20–32)
Calcium: 9.6 mg/dL (ref 8.6–10.3)
Chloride: 100 mmol/L (ref 98–110)
Creat: 1.48 mg/dL — ABNORMAL HIGH (ref 0.70–1.25)
GFR, Est African American: 57 mL/min/{1.73_m2} — ABNORMAL LOW (ref >=60)
GFR, Est Non African American: 49 mL/min/{1.73_m2} — ABNORMAL LOW (ref >=60)
Globulin: 2.9 g/dL (calc) (ref 1.9–3.7)
Glucose, Bld: 203 mg/dL — ABNORMAL HIGH (ref 65–99)
Potassium: 4.1 mmol/L (ref 3.5–5.3)
Sodium: 138 mmol/L (ref 135–146)
Total Bilirubin: 1.4 mg/dL — ABNORMAL HIGH (ref 0.2–1.2)
Total Protein: 7.3 g/dL (ref 6.1–8.1)

## 2020-07-21 LAB — LIPID PANEL
Cholesterol: 220 mg/dL — ABNORMAL HIGH (ref <200)
HDL: 35 mg/dL — ABNORMAL LOW (ref >=40)
LDL Cholesterol (Calc): 161 mg/dL (calc) — ABNORMAL HIGH
Non-HDL Cholesterol (Calc): 185 mg/dL (calc) — ABNORMAL HIGH (ref <130)
Total CHOL/HDL Ratio: 6.3 (calc) — ABNORMAL HIGH (ref <5.0)
Triglycerides: 122 mg/dL (ref <150)

## 2020-07-21 LAB — TSH: TSH: 2.97 mIU/L (ref 0.40–4.50)

## 2020-07-22 ENCOUNTER — Encounter: Payer: Self-pay | Admitting: Family Medicine

## 2020-07-22 ENCOUNTER — Other Ambulatory Visit: Payer: Self-pay

## 2020-07-22 ENCOUNTER — Ambulatory Visit (INDEPENDENT_AMBULATORY_CARE_PROVIDER_SITE_OTHER): Payer: Self-pay | Admitting: Family Medicine

## 2020-07-22 VITALS — BP 144/75 | HR 56 | Ht 72.0 in | Wt 253.1 lb

## 2020-07-22 DIAGNOSIS — I119 Hypertensive heart disease without heart failure: Secondary | ICD-10-CM

## 2020-07-22 DIAGNOSIS — E139 Other specified diabetes mellitus without complications: Secondary | ICD-10-CM

## 2020-07-22 DIAGNOSIS — E669 Obesity, unspecified: Secondary | ICD-10-CM

## 2020-07-22 NOTE — Progress Notes (Signed)
Established Patient Office Visit  SUBJECTIVE:  Subjective  Patient ID: Jesse Humphrey, male    DOB: April 29, 1956  Age: 65 y.o. MRN: [id number]  CC:  Chief Complaint  Patient presents with  . Lab Results    HPI Jesse Humphrey is a 65 y.o. male presenting today for     History reviewed. No pertinent past medical history.  History reviewed. No pertinent surgical history.  History reviewed. No pertinent family history.  Social History   Socioeconomic History  . Marital status: Single    Spouse name: Not on file  . Number of children: Not on file  . Years of education: Not on file  . Highest education level: Not on file  Occupational History  . Not on file  Tobacco Use  . Smoking status: Never Smoker  . Smokeless tobacco: Never Used  Substance and Sexual Activity  . Alcohol use: Never  . Drug use: Never  . Sexual activity: Not on file  Other Topics Concern  . Not on file  Social History Narrative  . Not on file   Social Determinants of Health   Financial Resource Strain: Not on file  Food Insecurity: Not on file  Transportation Needs: Not on file  Physical Activity: Not on file  Stress: Not on file  Social Connections: Not on file  Intimate Partner Violence: Not on file     Current Outpatient Medications:  .  cloNIDine (CATAPRES) 0.3 MG tablet, Take 0.3 mg by mouth 2 (two) times daily., Disp: , Rfl:  .  doxazosin (CARDURA) 4 MG tablet, Take 1 tablet (4 mg total) by mouth daily., Disp: 30 tablet, Rfl: 4 .  furosemide (LASIX) 20 MG tablet, Take 20 mg by mouth., Disp: , Rfl:  .  glipiZIDE (GLUCOTROL XL) 10 MG 24 hr tablet, Take 1 tablet (10 mg total) by mouth daily., Disp: 90 tablet, Rfl: 3 .  loratadine (CLARITIN) 10 MG tablet, Take 10 mg by mouth daily., Disp: , Rfl:  .  mupirocin ointment (BACTROBAN) 2 %, Apply 1 application topically 2 (two) times daily., Disp: 22 g, Rfl: 4 .  olmesartan (BENICAR) 40 MG tablet, Take 1 tablet (40 mg total) by mouth daily.,  Disp: 90 tablet, Rfl: 3 .  pioglitazone (ACTOS) 45 MG tablet, Take 1 tablet (45 mg total) by mouth daily., Disp: 90 tablet, Rfl: 3 .  sotalol (BETAPACE) 80 MG tablet, TAKE FOUR TABLETS BY MOUTH DAILY, Disp: 120 tablet, Rfl: 3 .  SOTALOL AF 80 MG TABS, Take 4 tablets (320 mg total) by mouth daily., Disp: 120 tablet, Rfl: 4 .  warfarin (COUMADIN) 5 MG tablet, Take 5 mg by mouth daily., Disp: , Rfl:    No Known Allergies  ROS Review of Systems  Constitutional: Negative.   HENT: Negative.   Respiratory: Negative.   Cardiovascular: Negative.   Genitourinary: Negative.   Musculoskeletal: Negative.   Neurological: Negative.   Psychiatric/Behavioral: Negative.      OBJECTIVE:    Physical Exam Vitals and nursing note reviewed.  HENT:     Head: Normocephalic.     Mouth/Throat:     Mouth: Mucous membranes are moist.  Eyes:     Pupils: Pupils are equal, round, and reactive to light.  Cardiovascular:     Rate and Rhythm: Normal rate and regular rhythm.  Musculoskeletal:     Cervical back: Normal range of motion.  Neurological:     Mental Status: He is alert.  Psychiatric:  Mood and Affect: Mood normal.     BP (!) 144/75   Pulse (!) 56   Ht 6' (1.829 m)   Wt 253 lb 1.6 oz (114.8 kg)   SpO2 98%   BMI 34.33 kg/m  Wt Readings from Last 3 Encounters:  07/22/20 253 lb 1.6 oz (114.8 kg)  05/11/20 265 lb 8 oz (120.4 kg)  02/10/20 255 lb 3.2 oz (115.8 kg)    Health Maintenance Due  Topic Date Due  . Hepatitis C Screening  Never done  . COVID-19 Vaccine (1) Never done  . FOOT EXAM  Never done  . OPHTHALMOLOGY EXAM  Never done  . HIV Screening  Never done  . TETANUS/TDAP  Never done  . COLONOSCOPY (Pts 45-52yrs Insurance coverage will need to be confirmed)  Never done  . INFLUENZA VACCINE  Never done    There are no preventive care reminders to display for this patient.  CBC Latest Ref Rng & Units 07/20/2020 01/18/2014 12/18/2013  WBC 3.8 - 10.8 Thousand/uL 4.3 5.9 9.0   Hemoglobin 13.2 - 17.1 g/dL 13.1(L) 11.9(L) 13.6  Hematocrit 38.5 - 50.0 % 38.2(L) 36.6(L) 40.7  Platelets 140 - 400 Thousand/uL 168 220 247   CMP Latest Ref Rng & Units 07/20/2020 01/18/2014 12/18/2013  Glucose 65 - 99 mg/dL 902(I) - 097(D)  BUN 7 - 25 mg/dL 10 - 53(G)  Creatinine 0.70 - 1.25 mg/dL 9.92(E) 2.68(T) 4.19(Q)  Sodium 135 - 146 mmol/L 138 - 132(L)  Potassium 3.5 - 5.3 mmol/L 4.1 - 3.8  Chloride 98 - 110 mmol/L 100 - 99  CO2 20 - 32 mmol/L 28 - 23  Calcium 8.6 - 10.3 mg/dL 9.6 - 9.5  Total Protein 6.1 - 8.1 g/dL 7.3 7.4 8.1  Total Bilirubin 0.2 - 1.2 mg/dL 2.2(W) 0.7 0.5  Alkaline Phos Unit/L - 54 59  AST 10 - 35 U/L 18 19 27   ALT 9 - 46 U/L 13 33 31    Lab Results  Component Value Date   TSH 2.97 07/20/2020   Lab Results  Component Value Date   ALBUMIN 4.0 01/18/2014   ANIONGAP 10 12/18/2013   Lab Results  Component Value Date   CHOL 220 (H) 07/20/2020   HDL 35 (L) 07/20/2020   LDLCALC 161 (H) 07/20/2020   CHOLHDL 6.3 (H) 07/20/2020   Lab Results  Component Value Date   TRIG 122 07/20/2020   No results found for: HGBA1C    ASSESSMENT & PLAN:   Problem List Items Addressed This Visit      Cardiovascular and Mediastinum   Hypertensive heart disease - Primary    Patient's blood pressure is within the desired range. Medication side effects include: fatigue Continue current treatment regimen. Continue current medications.  Patient is going to watch sodium intake.          Endocrine   Diabetes 1.5, managed as type 2 (HCC)    Diabetes mellitus Type II, under excellent control.. Discussed general issues about diabetes pathophysiology and management.         Other   Obesity (BMI 30-39.9)    Watching carbs, he travels a lot. He will stay away from fast food as much.          No orders of the defined types were placed in this encounter.     Follow-up: No follow-ups on file.    07/22/2020, FNP [facility] [address] [place], [place]

## 2020-07-22 NOTE — Assessment & Plan Note (Signed)
Patient's blood pressure is within the desired range. Medication side effects include: fatigue Continue current treatment regimen. Continue current medications.  Patient is going to watch sodium intake.

## 2020-07-22 NOTE — Assessment & Plan Note (Signed)
Watching carbs, he travels a lot. He will stay away from fast food as much.

## 2020-07-22 NOTE — Assessment & Plan Note (Signed)
Diabetes mellitus Type II, under excellent control.. Discussed general issues about diabetes pathophysiology and management.  

## 2020-08-05 ENCOUNTER — Other Ambulatory Visit: Payer: Self-pay | Admitting: *Deleted

## 2020-08-05 MED ORDER — CLONIDINE HCL 0.2 MG PO TABS: 0.2000 mg | ORAL_TABLET | Freq: Two times a day (BID) | ORAL | 4 refills | Status: DC

## 2020-08-05 MED ORDER — AZITHROMYCIN 250 MG PO TABS: ORAL_TABLET | ORAL | 0 refills | Status: DC

## 2020-08-10 ENCOUNTER — Ambulatory Visit: Payer: Self-pay | Admitting: Internal Medicine

## 2020-08-12 ENCOUNTER — Other Ambulatory Visit: Payer: Self-pay

## 2020-08-12 ENCOUNTER — Emergency Department: Payer: Self-pay

## 2020-08-12 ENCOUNTER — Ambulatory Visit (INDEPENDENT_AMBULATORY_CARE_PROVIDER_SITE_OTHER): Payer: Self-pay | Admitting: Family Medicine

## 2020-08-12 ENCOUNTER — Encounter: Payer: Self-pay | Admitting: Family Medicine

## 2020-08-12 ENCOUNTER — Emergency Department
Admission: EM | Admit: 2020-08-12 | Discharge: 2020-08-12 | Disposition: A | Discharge status: Home / Self Care | Payer: Self-pay | Attending: Emergency Medicine | Admitting: Emergency Medicine

## 2020-08-12 ENCOUNTER — Encounter: Payer: Self-pay | Admitting: Emergency Medicine

## 2020-08-12 VITALS — BP 162/84 | HR 142

## 2020-08-12 DIAGNOSIS — U071 COVID-19: Secondary | ICD-10-CM | POA: Insufficient documentation

## 2020-08-12 DIAGNOSIS — Z79899 Other long term (current) drug therapy: Secondary | ICD-10-CM | POA: Insufficient documentation

## 2020-08-12 DIAGNOSIS — Z7901 Long term (current) use of anticoagulants: Secondary | ICD-10-CM | POA: Insufficient documentation

## 2020-08-12 DIAGNOSIS — I10 Essential (primary) hypertension: Secondary | ICD-10-CM | POA: Insufficient documentation

## 2020-08-12 DIAGNOSIS — I4891 Unspecified atrial fibrillation: Secondary | ICD-10-CM | POA: Insufficient documentation

## 2020-08-12 DIAGNOSIS — E119 Type 2 diabetes mellitus without complications: Secondary | ICD-10-CM | POA: Insufficient documentation

## 2020-08-12 HISTORY — DX: Type 2 diabetes mellitus without complications: E11.9

## 2020-08-12 HISTORY — DX: Essential (primary) hypertension: I10

## 2020-08-12 HISTORY — DX: Unspecified atrial fibrillation: I48.91

## 2020-08-12 LAB — CBC WITH DIFFERENTIAL/PLATELET
Abs Immature Granulocytes: 0.01 10*3/uL (ref 0.00–0.07)
Basophils Absolute: 0 10*3/uL (ref 0.0–0.1)
Basophils Relative: 0 %
Eosinophils Absolute: 0 10*3/uL (ref 0.0–0.5)
Eosinophils Relative: 0 %
HCT: 42.3 % (ref 39.0–52.0)
Hemoglobin: 14.4 g/dL (ref 13.0–17.0)
Immature Granulocytes: 0 %
Lymphocytes Relative: 15 %
Lymphs Abs: 1 10*3/uL (ref 0.7–4.0)
MCH: 29.3 pg (ref 26.0–34.0)
MCHC: 34 g/dL (ref 30.0–36.0)
MCV: 86.2 fL (ref 80.0–100.0)
Monocytes Absolute: 0.6 10*3/uL (ref 0.1–1.0)
Monocytes Relative: 9 %
Neutro Abs: 4.8 10*3/uL (ref 1.7–7.7)
Neutrophils Relative %: 76 %
Platelets: 158 10*3/uL (ref 150–400)
RBC: 4.91 MIL/uL (ref 4.22–5.81)
RDW: 14.2 % (ref 11.5–15.5)
WBC: 6.3 10*3/uL (ref 4.0–10.5)
nRBC: 0 % (ref 0.0–0.2)

## 2020-08-12 LAB — COMPREHENSIVE METABOLIC PANEL
ALT: 25 U/L (ref 0–44)
AST: 48 U/L — ABNORMAL HIGH (ref 15–41)
Albumin: 4.2 g/dL (ref 3.5–5.0)
Alkaline Phosphatase: 65 U/L (ref 38–126)
Anion gap: 12 (ref 5–15)
BUN: 19 mg/dL (ref 8–23)
CO2: 25 mmol/L (ref 22–32)
Calcium: 9.1 mg/dL (ref 8.9–10.3)
Chloride: 102 mmol/L (ref 98–111)
Creatinine, Ser: 1.12 mg/dL (ref 0.61–1.24)
GFR, Estimated: >60 mL/min (ref >60)
Glucose, Bld: 150 mg/dL — ABNORMAL HIGH (ref 70–99)
Potassium: 3.5 mmol/L (ref 3.5–5.1)
Sodium: 139 mmol/L (ref 135–145)
Total Bilirubin: 1.9 mg/dL — ABNORMAL HIGH (ref 0.3–1.2)
Total Protein: 7.8 g/dL (ref 6.5–8.1)

## 2020-08-12 LAB — TROPONIN I (HIGH SENSITIVITY)
Troponin I (High Sensitivity): 18 ng/L — ABNORMAL HIGH (ref <18)
Troponin I (High Sensitivity): 20 ng/L — ABNORMAL HIGH (ref <18)

## 2020-08-12 LAB — APTT: aPTT: 29 seconds (ref 24–36)

## 2020-08-12 LAB — BRAIN NATRIURETIC PEPTIDE: B Natriuretic Peptide: 605.6 pg/mL — ABNORMAL HIGH (ref 0.0–100.0)

## 2020-08-12 LAB — PROTIME-INR
INR: 1.2 (ref 0.8–1.2)
Prothrombin Time: 15.1 seconds (ref 11.4–15.2)

## 2020-08-12 LAB — POCT INR: INR: 2 (ref 2.0–3.0)

## 2020-08-12 LAB — MAGNESIUM: Magnesium: 1.9 mg/dL (ref 1.7–2.4)

## 2020-08-12 MED ORDER — DILTIAZEM HCL ER COATED BEADS 180 MG PO CP24: 180.0000 mg | ORAL_CAPSULE | Freq: Every day | ORAL | 0 refills | Status: DC

## 2020-08-12 MED ORDER — DILTIAZEM HCL 60 MG PO TABS
30.0000 mg | ORAL_TABLET | Freq: Once | ORAL | Status: AC
  Administered 2020-08-12: 30 mg via ORAL
  Filled 2020-08-12: qty 1

## 2020-08-12 MED ORDER — ASPIRIN 81 MG PO CHEW: 324.0000 mg | CHEWABLE_TABLET | Freq: Once | ORAL | Status: DC

## 2020-08-12 MED ORDER — DILTIAZEM HCL ER COATED BEADS 180 MG PO CP24
180.0000 mg | ORAL_CAPSULE | Freq: Once | ORAL | Status: AC
  Administered 2020-08-12: 180 mg via ORAL
  Filled 2020-08-12: qty 1

## 2020-08-12 MED ORDER — MAGNESIUM SULFATE 2 GM/50ML IV SOLN
2.0000 g | Freq: Once | INTRAVENOUS | Status: AC
  Administered 2020-08-12: 2 g via INTRAVENOUS
  Filled 2020-08-12: qty 50

## 2020-08-12 MED ORDER — DILTIAZEM HCL 25 MG/5ML IV SOLN
15.0000 mg | Freq: Once | INTRAVENOUS | Status: AC
  Administered 2020-08-12: 15 mg via INTRAVENOUS
  Filled 2020-08-12: qty 5

## 2020-08-12 MED ORDER — DILTIAZEM HCL 25 MG/5ML IV SOLN
10.0000 mg | Freq: Once | INTRAVENOUS | Status: AC
  Administered 2020-08-12: 10 mg via INTRAVENOUS
  Filled 2020-08-12: qty 5

## 2020-08-12 MED ORDER — POTASSIUM CHLORIDE CRYS ER 20 MEQ PO TBCR
40.0000 meq | EXTENDED_RELEASE_TABLET | Freq: Once | ORAL | Status: AC
  Administered 2020-08-12: 40 meq via ORAL
  Filled 2020-08-12: qty 2

## 2020-08-12 NOTE — ED Notes (Signed)
Radiology at bedside

## 2020-08-12 NOTE — Progress Notes (Signed)
Established Patient Office Visit  SUBJECTIVE:  Subjective  Patient ID: Jesse Humphrey, male    DOB: [birth year]  Age: 65 y.o. MRN: [id number]  CC:  Chief Complaint  Patient presents with  . Fatigue    Patient states that he has no energy     HPI Jesse Humphrey is a 65 y.o. male presenting today for     Past Medical History:  Diagnosis Date  . Atrial fibrillation (HCC)   . Diabetes mellitus without complication (HCC)   . Hypertension     History reviewed. No pertinent surgical history.  History reviewed. No pertinent family history.  Social History   Socioeconomic History  . Marital status: Single    Spouse name: Not on file  . Number of children: Not on file  . Years of education: Not on file  . Highest education level: Not on file  Occupational History  . Not on file  Tobacco Use  . Smoking status: Never Smoker  . Smokeless tobacco: Never Used  Substance and Sexual Activity  . Alcohol use: Never  . Drug use: Never  . Sexual activity: Not on file  Other Topics Concern  . Not on file  Social History Narrative  . Not on file   Social Determinants of Health   Financial Resource Strain: Not on file  Food Insecurity: Not on file  Transportation Needs: Not on file  Physical Activity: Not on file  Stress: Not on file  Social Connections: Not on file  Intimate Partner Violence: Not on file    No current facility-administered medications for this visit.  Current Outpatient Medications:  .  cloNIDine (CATAPRES) 0.2 MG tablet, Take 1 tablet (0.2 mg total) by mouth 2 (two) times daily., Disp: 60 tablet, Rfl: 4 .  doxazosin (CARDURA) 4 MG tablet, Take 1 tablet (4 mg total) by mouth daily., Disp: 30 tablet, Rfl: 4 .  furosemide (LASIX) 20 MG tablet, Take 20 mg by mouth., Disp: , Rfl:  .  glipiZIDE (GLUCOTROL XL) 10 MG 24 hr tablet, Take 1 tablet (10 mg total) by mouth daily., Disp: 90 tablet, Rfl: 3 .  loratadine (CLARITIN) 10 MG tablet, Take 10 mg by mouth  daily., Disp: , Rfl:  .  mupirocin ointment (BACTROBAN) 2 %, Apply 1 application topically 2 (two) times daily., Disp: 22 g, Rfl: 4 .  olmesartan (BENICAR) 40 MG tablet, Take 1 tablet (40 mg total) by mouth daily., Disp: 90 tablet, Rfl: 3 .  pioglitazone (ACTOS) 45 MG tablet, Take 1 tablet (45 mg total) by mouth daily., Disp: 90 tablet, Rfl: 3 .  sotalol (BETAPACE) 80 MG tablet, TAKE FOUR TABLETS BY MOUTH DAILY, Disp: 120 tablet, Rfl: 3 .  SOTALOL AF 80 MG TABS, Take 4 tablets (320 mg total) by mouth daily., Disp: 120 tablet, Rfl: 4 .  warfarin (COUMADIN) 5 MG tablet, Take 5 mg by mouth daily., Disp: , Rfl:   Facility-Administered Medications Ordered in Other Visits:  .  magnesium sulfate IVPB 2 g 50 mL, 2 g, Intravenous, Once, Concha Se, MD, Last Rate: 50 mL/hr at 08/12/20 1312, 2 g at 08/12/20 1312   No Known Allergies  ROS Review of Systems  Constitutional: Positive for fatigue.  HENT: Negative.   Eyes: Negative.   Respiratory: Positive for shortness of breath.   Cardiovascular: Negative.   Gastrointestinal: Negative.   Genitourinary: Negative.   Skin: Negative.   Psychiatric/Behavioral: Positive for decreased concentration.     OBJECTIVE:    Physical Exam  Vitals and nursing note reviewed.  Constitutional:      Appearance: He is obese.  HENT:     Right Ear: Tympanic membrane normal.     Left Ear: Tympanic membrane normal.     Mouth/Throat:     Mouth: Mucous membranes are moist.  Cardiovascular:     Rate and Rhythm: Tachycardia present. Rhythm irregular.  Musculoskeletal:        General: Normal range of motion.  Psychiatric:        Mood and Affect: Mood normal.     BP (!) 162/84   Pulse (!) 142  Wt Readings from Last 3 Encounters:  08/12/20 254 lb (115.2 kg)  07/22/20 253 lb 1.6 oz (114.8 kg)  05/11/20 265 lb 8 oz (120.4 kg)    Health Maintenance Due  Topic Date Due  . Hepatitis C Screening  Never done  . COVID-19 Vaccine (1) Never done  . FOOT EXAM   Never done  . OPHTHALMOLOGY EXAM  Never done  . HIV Screening  Never done  . TETANUS/TDAP  Never done  . COLONOSCOPY (Pts 45-57yrs Insurance coverage will need to be confirmed)  Never done  . INFLUENZA VACCINE  Never done    There are no preventive care reminders to display for this patient.  CBC Latest Ref Rng & Units 08/12/2020 07/20/2020 01/18/2014  WBC 4.0 - 10.5 K/uL 6.3 4.3 5.9  Hemoglobin 13.0 - 17.0 g/dL 66.4 13.1(L) 11.9(L)  Hematocrit 39.0 - 52.0 % 42.3 38.2(L) 36.6(L)  Platelets 150 - 400 K/uL 158 168 220   CMP Latest Ref Rng & Units 08/12/2020 07/20/2020 01/18/2014  Glucose 70 - 99 mg/dL 403(K) 742(V) -  BUN 8 - 23 mg/dL 19 10 -  Creatinine 9.56 - 1.24 mg/dL 3.87 5.64(P) 3.29(J)  Sodium 135 - 145 mmol/L 139 138 -  Potassium 3.5 - 5.1 mmol/L 3.5 4.1 -  Chloride 98 - 111 mmol/L 102 100 -  CO2 22 - 32 mmol/L 25 28 -  Calcium 8.9 - 10.3 mg/dL 9.1 9.6 -  Total Protein 6.5 - 8.1 g/dL 7.8 7.3 7.4  Total Bilirubin 0.3 - 1.2 mg/dL 1.8(A) 4.1(Y) 0.7  Alkaline Phos 38 - 126 U/L 65 - 54  AST 15 - 41 U/L 48(H) 18 19  ALT 0 - 44 U/L 25 13 33    Lab Results  Component Value Date   TSH 2.97 07/20/2020   Lab Results  Component Value Date   ALBUMIN 4.2 08/12/2020   ANIONGAP 12 08/12/2020   Lab Results  Component Value Date   CHOL 220 (H) 07/20/2020   HDL 35 (L) 07/20/2020   LDLCALC 161 (H) 07/20/2020   CHOLHDL 6.3 (H) 07/20/2020   Lab Results  Component Value Date   TRIG 122 07/20/2020   No results found for: HGBA1C    ASSESSMENT & PLAN:   Problem List Items Addressed This Visit      Cardiovascular and Mediastinum   Atrial fibrillation (HCC) - Primary    Pt in today not feeling well, weak and tired, says that he can't keep his head up. He denies CP at this moment but reports not feeling well. Plan- Patient is in uncontrolled A Fib symptomatic with HR of 142-150, his behavior is altered per norm, 4 baby Asa given, 911 activated, patient transported to [facility] via EMS for  evaluation.       Relevant Orders   POCT INR (Completed)   EKG 12-Lead      No orders of  the defined types were placed in this encounter.     Follow-up: No follow-ups on file.    Irish Lack, FNP [facility] [address] [place], [place] [address]

## 2020-08-12 NOTE — Consult Note (Signed)
Jesse Humphrey is a 65 y.o. male  [id number]  Primary Cardiologist: Jesse Humphrey Reason for Consultation: Atrial fibrillation with rapid ventricular response rate  HPI: This is a 65 year old male patient who presented to the hospital with atrial fibrillation with rapid ventricular response rate and is currently on sotalol and Cardizem drip was added.  Patient has some palpitation and shortness of breath and wants to go home.   Review of Systems: No orthopnea PND or leg swelling   Past Medical History:  Diagnosis Date  . Atrial fibrillation (HCC)   . Diabetes mellitus without complication (HCC)   . Hypertension     (Not in a hospital admission)    . potassium chloride  40 mEq Oral Once    Infusions: . magnesium sulfate bolus IVPB      No Known Allergies  Social History   Socioeconomic History  . Marital status: Single    Spouse name: Not on file  . Number of children: Not on file  . Years of education: Not on file  . Highest education level: Not on file  Occupational History  . Not on file  Tobacco Use  . Smoking status: Never Smoker  . Smokeless tobacco: Never Used  Substance and Sexual Activity  . Alcohol use: Never  . Drug use: Never  . Sexual activity: Not on file  Other Topics Concern  . Not on file  Social History Narrative  . Not on file   Social Determinants of Health   Financial Resource Strain: Not on file  Food Insecurity: Not on file  Transportation Needs: Not on file  Physical Activity: Not on file  Stress: Not on file  Social Connections: Not on file  Intimate Partner Violence: Not on file    History reviewed. No pertinent family history.  PHYSICAL EXAM: Vitals:   08/12/20 1200 08/12/20 1230  BP: (!) 179/106 (!) 166/97  Pulse: (!) 126 82  Resp: 19 15  SpO2: 94% 97%    No intake or output data in the 24 hours ending 08/12/20 1254  General:  Well appearing. No respiratory difficulty HEENT: normal Neck: supple. no JVD.  Carotids 2+ bilat; no bruits. No lymphadenopathy or thryomegaly appreciated. Cor: PMI nondisplaced. Regular rate & rhythm. No rubs, gallops or murmurs. Lungs: clear Abdomen: soft, nontender, nondistended. No hepatosplenomegaly. No bruits or masses. Good bowel sounds. Extremities: no cyanosis, clubbing, rash, edema Neuro: alert & oriented x 3, cranial nerves grossly intact. moves all 4 extremities w/o difficulty. Affect pleasant.  ECG: Atrial fibrillation with rapid ventricular response rate  Results for orders placed or performed during the hospital encounter of 08/12/20 (from the past 24 hour(s))  Brain natriuretic peptide     Status: Abnormal   Collection Time: 08/12/20 11:04 AM  Result Value Ref Range   B Natriuretic Peptide 605.6 (H) 0.0 - 100.0 pg/mL  CBC with Differential     Status: None   Collection Time: 08/12/20 11:07 AM  Result Value Ref Range   WBC 6.3 4.0 - 10.5 K/uL   RBC 4.91 4.22 - 5.81 MIL/uL   Hemoglobin 14.4 13.0 - 17.0 g/dL   HCT 62.2 29.7 - 98.9 %   MCV 86.2 80.0 - 100.0 fL   MCH 29.3 26.0 - 34.0 pg   MCHC 34.0 30.0 - 36.0 g/dL   RDW 21.1 94.1 - 74.0 %   Platelets 158 150 - 400 K/uL   nRBC 0.0 0.0 - 0.2 %   Neutrophils Relative % 76 %  Neutro Abs 4.8 1.7 - 7.7 K/uL   Lymphocytes Relative 15 %   Lymphs Abs 1.0 0.7 - 4.0 K/uL   Monocytes Relative 9 %   Monocytes Absolute 0.6 0.1 - 1.0 K/uL   Eosinophils Relative 0 %   Eosinophils Absolute 0.0 0.0 - 0.5 K/uL   Basophils Relative 0 %   Basophils Absolute 0.0 0.0 - 0.1 K/uL   Immature Granulocytes 0 %   Abs Immature Granulocytes 0.01 0.00 - 0.07 K/uL  Comprehensive metabolic panel     Status: Abnormal   Collection Time: 08/12/20 11:07 AM  Result Value Ref Range   Sodium 139 135 - 145 mmol/L   Potassium 3.5 3.5 - 5.1 mmol/L   Chloride 102 98 - 111 mmol/L   CO2 25 22 - 32 mmol/L   Glucose, Bld 150 (H) 70 - 99 mg/dL   BUN 19 8 - 23 mg/dL   Creatinine, Ser 4.19 0.61 - 1.24 mg/dL   Calcium 9.1 8.9 - 37.9  mg/dL   Total Protein 7.8 6.5 - 8.1 g/dL   Albumin 4.2 3.5 - 5.0 g/dL   AST 48 (H) 15 - 41 U/L   ALT 25 0 - 44 U/L   Alkaline Phosphatase 65 38 - 126 U/L   Total Bilirubin 1.9 (H) 0.3 - 1.2 mg/dL   GFR, Estimated >02 >40 mL/min   Anion gap 12 5 - 15  Magnesium     Status: None   Collection Time: 08/12/20 11:07 AM  Result Value Ref Range   Magnesium 1.9 1.7 - 2.4 mg/dL  Troponin I (High Sensitivity)     Status: Abnormal   Collection Time: 08/12/20 11:07 AM  Result Value Ref Range   Troponin I (High Sensitivity) 18 (H) <18 ng/L  Protime-INR     Status: None   Collection Time: 08/12/20 11:07 AM  Result Value Ref Range   Prothrombin Time 15.1 11.4 - 15.2 seconds   INR 1.2 0.8 - 1.2  APTT     Status: None   Collection Time: 08/12/20 11:07 AM  Result Value Ref Range   aPTT 29 24 - 36 seconds   No results found.   ASSESSMENT AND PLAN: Atrial fibrillation with rapid ventricular response rate.  Agree with adding diltiazem to sotalol.  Sotalol also can be increased to 80 twice a day.  If patient wants to go home and rate gets controlled can see the patient Monday morning at 9 AM.  In the office.  Jesse Humphrey A

## 2020-08-12 NOTE — Assessment & Plan Note (Signed)
Pt in today not feeling well, weak and tired, says that he can't keep his head up. He denies CP at this moment but reports not feeling well. Plan- Patient is in uncontrolled A Fib symptomatic with HR of 142-150, his behavior is altered per norm, 4 baby Asa given, 911 activated, patient transported to [facility] via EMS for evaluation.

## 2020-08-12 NOTE — Discharge Instructions (Addendum)
You are leaving against cardiology's advice.  We are going to start you on diltiazem.  We gave you a dose today you should take your next dose tomorrow.  Follow-up on Monday at 9am  with the cardiology team   Return to the ER if you develop worsening symptoms or any other concern

## 2020-08-12 NOTE — ED Triage Notes (Signed)
Pt comes into the ED via [facility] from [facility] c/o atrial fibrillation RVR.  Pt asymptomatic other than increased fatigue.  Pt has a h/o a-fib.  Pt given 324 ASA.  Pt has even and unlabored respirations.  Pt has gone through new medication changes with clonidine 3mg  increased clonidine 6 mg.

## 2020-08-12 NOTE — ED Provider Notes (Signed)
[facility] Emergency Department Provider Note  ____________________________________________   Event Date/Time   First MD Initiated Contact with Patient 08/12/20 1100     (approximate)  I have reviewed the triage vital signs and the nursing notes.   HISTORY  Chief Complaint Atrial Fibrillation    HPI Jesse Humphrey is a 65 y.o. male with atrial fibrillation, diabetes, hypertension who comes in from his family doctor for A. fib with RVR.  Patient states that he went to his family doctor for fatigue.  His recent medication change was his clonidine was increased from 3 mg to 6 mg.  He went to the office and was found to be in A. fib with RVR with heart rates in the 160s.  He was given 324 of aspirin.  He denies any other symptoms other than some fatigue that is moderate, constant, nothing makes it better, nothing makes it worse.          Past Medical History:  Diagnosis Date  . Atrial fibrillation (HCC)   . Diabetes mellitus without complication (HCC)   . Hypertension     Patient Active Problem List   Diagnosis Date Noted  . Hypertensive heart disease 02/10/2020  . Atrial fibrillation (HCC) 02/10/2020  . Obesity (BMI 30-39.9) 02/10/2020  . 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 No pertinent surgical history.  Prior to Admission medications   Medication Sig Start Date End Date Taking? Authorizing Provider  cloNIDine (CATAPRES) 0.2 MG tablet Take 1 tablet (0.2 mg total) by mouth 2 (two) times daily. 08/05/20   Irish Lack, FNP  doxazosin (CARDURA) 4 MG tablet Take 1 tablet (4 mg total) by mouth daily. 02/29/20   Corky Downs, MD  furosemide (LASIX) 20 MG tablet Take 20 mg by mouth.    [provider]  glipiZIDE (GLUCOTROL XL) 10 MG 24 hr tablet Take 1 tablet (10 mg total) by mouth daily. 03/09/20   Corky Downs, MD  loratadine (CLARITIN) 10 MG tablet Take 10 mg by mouth daily.    [provider]   mupirocin ointment (BACTROBAN) 2 % Apply 1 application topically 2 (two) times daily. 02/10/20   Corky Downs, MD  olmesartan (BENICAR) 40 MG tablet Take 1 tablet (40 mg total) by mouth daily. 05/11/20   Corky Downs, MD  pioglitazone (ACTOS) 45 MG tablet Take 1 tablet (45 mg total) by mouth daily. 05/11/20   Corky Downs, MD  sotalol (BETAPACE) 80 MG tablet TAKE FOUR TABLETS BY MOUTH DAILY 04/29/20   Corky Downs, MD  SOTALOL AF 80 MG TABS Take 4 tablets (320 mg total) by mouth daily. 05/02/20   Corky Downs, MD  warfarin (COUMADIN) 5 MG tablet Take 5 mg by mouth daily.    [provider]    Allergies Patient has no known allergies.  History reviewed. No pertinent family history.  Social History Social History   Tobacco Use  . Smoking status: Never Smoker  . Smokeless tobacco: Never Used  Substance Use Topics  . Alcohol use: Never  . Drug use: Never      Review of Systems Constitutional: No fever/chills, fatigue Eyes: No visual changes. ENT: No sore throat. Cardiovascular: Denies chest pain. Respiratory: Denies shortness of breath. Gastrointestinal: No abdominal pain.  No nausea, no vomiting.  No diarrhea.  No constipation. Genitourinary: Negative for dysuria. Musculoskeletal: Negative for back pain. Skin: Negative for rash. Neurological: Negative for headaches, focal weakness or numbness. All other ROS negative ____________________________________________  PHYSICAL EXAM:  VITAL SIGNS: ED Triage Vitals  Enc Vitals Group     BP 08/12/20 1130 (!) 177/101     Pulse Rate 08/12/20 1130 (!) 108     Resp 08/12/20 1130 18     Temp --      Temp src --      SpO2 08/12/20 1130 95 %     Weight 08/12/20 1103 254 lb (115.2 kg)     Height 08/12/20 1103 6' (1.829 m)     Head Circumference --      Peak Flow --      Pain Score 08/12/20 1103 0     Pain Loc --      Pain Edu? --      Excl. in GC? --     Constitutional: Alert and oriented. Well appearing and in  no acute distress. Eyes: Conjunctivae are normal. EOMI. Head: Atraumatic. Nose: No congestion/rhinnorhea. Mouth/Throat: Mucous membranes are moist.   Neck: No stridor. Trachea Midline. FROM Cardiovascular: Irregular, tachycardic grossly normal heart sounds.  Good peripheral circulation. Respiratory: Normal respiratory effort.  No retractions. Lungs CTAB. Gastrointestinal: Soft and nontender. No distention. No abdominal bruits.  Musculoskeletal: No lower extremity tenderness nor edema.  No joint effusions. Neurologic:  Normal speech and language. No gross focal neurologic deficits are appreciated.  Skin:  Skin is warm, dry and intact. No rash noted. Psychiatric: Mood and affect are normal. Speech and behavior are normal. GU: Deferred   ____________________________________________   LABS (all labs ordered are listed, but only abnormal results are displayed)  Labs Reviewed  COMPREHENSIVE METABOLIC PANEL - Abnormal; Notable for the following components:      Result Value   Glucose, Bld 150 (*)    AST 48 (*)    Total Bilirubin 1.9 (*)    All other components within normal limits  BRAIN NATRIURETIC PEPTIDE - Abnormal; Notable for the following components:   B Natriuretic Peptide 605.6 (*)    All other components within normal limits  TROPONIN I (HIGH SENSITIVITY) - Abnormal; Notable for the following components:   Troponin I (High Sensitivity) 18 (*)    All other components within normal limits  CBC WITH DIFFERENTIAL/PLATELET  MAGNESIUM  PROTIME-INR  APTT  TROPONIN I (HIGH SENSITIVITY)   ____________________________________________   ED ECG REPORT I, Concha Se, the attending physician, personally viewed and interpreted this ECG.  EKG is atrial fibrillation rate of 144, no ST elevation, no T wave inversions, normal intervals ____________________________________________  RADIOLOGY Vela Prose, personally viewed and evaluated these images (plain radiographs) as part of  my medical decision making, as well as reviewing the written report by the radiologist.  ED MD interpretation: No pneumonia  Official radiology report(s): DG Chest Portable 1 View  Result Date: 08/12/2020 CLINICAL DATA:  Shortness of breath.  Atrial fibrillation EXAM: PORTABLE CHEST 1 VIEW COMPARISON:  None. FINDINGS: There is mild atelectasis in the right base. The lungs elsewhere are clear. Heart size and pulmonary vascularity are normal. No adenopathy. No bone lesions. IMPRESSION: Mild right base atelectasis. Lungs otherwise clear. Heart size within normal limits. Electronically Signed   By: Bretta Bang III M.D.   On: 08/12/2020 13:14    ____________________________________________   PROCEDURES  Procedure(s) performed (including Critical Care):  .Critical Care Performed by: Concha Se, MD Authorized by: Concha Se, MD   Critical care provider statement:    Critical care time (minutes):  45   Critical care was necessary to treat  or prevent imminent or life-threatening deterioration of the following conditions:  Cardiac failure   Critical care was time spent personally by me on the following activities:  Discussions with consultants, evaluation of patient's response to treatment, examination of patient, ordering and performing treatments and interventions, ordering and review of laboratory studies, ordering and review of radiographic studies, pulse oximetry, re-evaluation of patient's condition, obtaining history from patient or surrogate and review of old charts .1-3 Lead EKG Interpretation Performed by: Concha Se, MD Authorized by: Concha Se, MD     Interpretation: abnormal     ECG rate:  100-140   ECG rate assessment: tachycardic     Rhythm: atrial fibrillation     Ectopy: none     Conduction: normal       ____________________________________________   INITIAL IMPRESSION / ASSESSMENT AND PLAN / ED COURSE  [name] was evaluated in Emergency  Department on 08/12/2020 for the symptoms described in the history of present illness. He was evaluated in the context of the global COVID-19 pandemic, which necessitated consideration that the patient might be at risk for infection with the SARS-CoV-2 virus that causes COVID-19. Institutional protocols and algorithms that pertain to the evaluation of patients at risk for COVID-19 are in a state of rapid change based on information released by regulatory bodies including the CDC and federal and state organizations. These policies and algorithms were followed during the patient's care in the ED.    Patient is a 65 year old gentleman with known atrial fibrillation on warfarin and sotalol who comes in for fatigue and noted to be in A. fib with RVR.  Patient typically is in atrial fibrillation and is on rate control with sotalol.  Denies changing his medication or missing doses.  Typically he states that his heart rate runs in the 60s.  Labs are ordered to evaluate for Electra abnormalities, AKI, ACS, heart failure although patient appears very well-appearing upon examination.  We will keep patient on the cardiac markers  10 mg of IV diltiazem was used heart rates went from the 150s to the 120s but slowly started creep back up.  I did discuss with the cardiology team Dr. Jeanie Cooks to make sure that the diltiazem can be used with the sotalol and it can be.  We will try another IV dose of 15 mg of IV diltiazem and give some oral diltiazem and see if we can keep him down.  Patient would like to be kept in the hospital if possible.  Labs show a slightly elevated BNP of 605.  Will get chest x-ray to evaluate for any edema although he does not look significantly fluid overloaded on exam  No anemia or signs of infection  K is 3.5 we will give some oral potassium and magnesium is 1.9 we will give some IV mag  Initial cardiac marker was slightly elevated but suspect this mild demand from A. fib.  Will get  repeat  Cardiac markers are stable. Discussed with patient and Dr.khan from cardiology came and saw patient. Heart rates continue to be in the 120s to 130s. Cardiology recommended admission to be started on amnio drip patient does not want to be admitted. He states he does not have insurance. Understands the risk of leaving including death and permanent disability with patient has a capacity make that decision. He would like to be discharged home. He understands that he will be leaving AGAINST MEDICAL ADVICE. I discussed with cardiology and they will start him on  a long-acting Cardizem 180 mg to take daily daily. We will give him his first dose here. He understands that if he develops worsening symptoms please return to the ER immediately. He will have follow-up with Dr. Welton Flakes on Monday at 9 AM but at this time he is not amenable to coming to the hospital       ____________________________________________   FINAL CLINICAL IMPRESSION(S) / ED DIAGNOSES   Final diagnoses:  Atrial fibrillation with rapid ventricular response (HCC)      MEDICATIONS GIVEN DURING THIS VISIT:  Medications  diltiazem (CARDIZEM CD) 24 hr capsule 180 mg (has no administration in time range)  diltiazem (CARDIZEM) injection 10 mg (10 mg Intravenous Given 08/12/20 1122)  diltiazem (CARDIZEM) tablet 30 mg (30 mg Oral Given 08/12/20 1218)  diltiazem (CARDIZEM) injection 15 mg (15 mg Intravenous Given 08/12/20 1217)  magnesium sulfate IVPB 2 g 50 mL (0 g Intravenous Stopped 08/12/20 1504)  potassium chloride SA (KLOR-CON) CR tablet 40 mEq (40 mEq Oral Given 08/12/20 1304)     ED Discharge Orders         Ordered    diltiazem (CARDIZEM CD) 180 MG 24 hr capsule  Daily        08/12/20 1503           Note:  This document was prepared using Dragon voice recognition software and may include unintentional dictation errors.   Concha Se, MD 08/12/20 [phone]

## 2020-08-13 LAB — SARS CORONAVIRUS 2 (TAT 6-24 HRS): SARS Coronavirus 2: POSITIVE — AB

## 2020-10-20 ENCOUNTER — Other Ambulatory Visit: Payer: Self-pay

## 2020-10-20 ENCOUNTER — Ambulatory Visit (INDEPENDENT_AMBULATORY_CARE_PROVIDER_SITE_OTHER): Payer: Self-pay | Admitting: Internal Medicine

## 2020-10-20 VITALS — BP 137/84 | HR 69 | Ht 73.0 in | Wt 248.5 lb

## 2020-10-20 DIAGNOSIS — I4891 Unspecified atrial fibrillation: Secondary | ICD-10-CM

## 2020-10-20 DIAGNOSIS — E139 Other specified diabetes mellitus without complications: Secondary | ICD-10-CM

## 2020-10-20 DIAGNOSIS — I119 Hypertensive heart disease without heart failure: Secondary | ICD-10-CM

## 2020-10-20 DIAGNOSIS — E669 Obesity, unspecified: Secondary | ICD-10-CM

## 2020-10-20 LAB — POCT INR: INR: 3.5 — AB (ref 2.0–3.0)

## 2020-10-20 NOTE — Progress Notes (Signed)
Established Patient Office Visit  Subjective:  Patient ID: Jesse Humphrey, male    DOB: 1955/07/16  Age: 65 y.o. MRN: [id number]  CC:  Chief Complaint  Patient presents with  . Diabetes    HPI  Hicks Feick presents for for checkup.  He is known to have hypertension diabetes exogenous obesity he does not smoke does not drink.  Recently he has been seen in the hospital with atrial fibrillation his medicines were changed.  He has a echocardiogram and stress test ordered.  Past Medical History:  Diagnosis Date  . Atrial fibrillation (HCC)   . Diabetes mellitus without complication (HCC)   . Hypertension     History reviewed. No pertinent surgical history.  History reviewed. No pertinent family history.  Social History   Socioeconomic History  . Marital status: Single    Spouse name: Not on file  . Number of children: Not on file  . Years of education: Not on file  . Highest education level: Not on file  Occupational History  . Not on file  Tobacco Use  . Smoking status: Never Smoker  . Smokeless tobacco: Never Used  Substance and Sexual Activity  . Alcohol use: Never  . Drug use: Never  . Sexual activity: Not on file  Other Topics Concern  . Not on file  Social History Narrative  . Not on file   Social Determinants of Health   Financial Resource Strain: Not on file  Food Insecurity: Not on file  Transportation Needs: Not on file  Physical Activity: Not on file  Stress: Not on file  Social Connections: Not on file  Intimate Partner Violence: Not on file     Current Outpatient Medications:  .  cloNIDine (CATAPRES) 0.2 MG tablet, Take 1 tablet (0.2 mg total) by mouth 2 (two) times daily., Disp: 60 tablet, Rfl: 4 .  diltiazem (CARDIZEM CD) 180 MG 24 hr capsule, Take 1 capsule (180 mg total) by mouth daily for 14 days., Disp: 14 capsule, Rfl: 0 .  doxazosin (CARDURA) 4 MG tablet, Take 1 tablet (4 mg total) by mouth daily., Disp: 30 tablet, Rfl: 4 .   furosemide (LASIX) 20 MG tablet, Take 20 mg by mouth., Disp: , Rfl:  .  glipiZIDE (GLUCOTROL XL) 10 MG 24 hr tablet, Take 1 tablet (10 mg total) by mouth daily., Disp: 90 tablet, Rfl: 3 .  loratadine (CLARITIN) 10 MG tablet, Take 10 mg by mouth daily., Disp: , Rfl:  .  mupirocin ointment (BACTROBAN) 2 %, Apply 1 application topically 2 (two) times daily., Disp: 22 g, Rfl: 4 .  olmesartan (BENICAR) 40 MG tablet, Take 1 tablet (40 mg total) by mouth daily., Disp: 90 tablet, Rfl: 3 .  pioglitazone (ACTOS) 45 MG tablet, Take 1 tablet (45 mg total) by mouth daily., Disp: 90 tablet, Rfl: 3 .  sotalol (BETAPACE) 80 MG tablet, TAKE FOUR TABLETS BY MOUTH DAILY, Disp: 120 tablet, Rfl: 3 .  warfarin (COUMADIN) 5 MG tablet, Take 5 mg by mouth daily., Disp: , Rfl:   Current Facility-Administered Medications:  .  aspirin chewable tablet 324 mg, 324 mg, Oral, Once, Irish Lack, FNP   No Known Allergies  ROS Review of Systems  Constitutional: Negative.   HENT: Negative.   Eyes: Negative.   Respiratory: Negative.   Cardiovascular: Negative.   Gastrointestinal: Negative.   Endocrine: Negative.   Genitourinary: Negative.   Musculoskeletal: Negative.   Skin: Negative.   Allergic/Immunologic: Negative.   Neurological: Negative.  Hematological: Negative.   Psychiatric/Behavioral: Negative.   All other systems reviewed and are negative.     Objective:    Physical Exam Vitals reviewed.  Constitutional:      Appearance: Normal appearance.  HENT:     Mouth/Throat:     Mouth: Mucous membranes are moist.  Eyes:     Pupils: Pupils are equal, round, and reactive to light.  Neck:     Vascular: No carotid bruit.  Cardiovascular:     Rate and Rhythm: Normal rate and regular rhythm.     Pulses: Normal pulses.     Heart sounds: Normal heart sounds.  Pulmonary:     Effort: Pulmonary effort is normal.     Breath sounds: Normal breath sounds.  Abdominal:     General: Bowel sounds are normal.      Palpations: Abdomen is soft. There is no hepatomegaly, splenomegaly or mass.     Tenderness: There is no abdominal tenderness.     Hernia: No hernia is present.  Musculoskeletal:     Cervical back: Neck supple.     Right lower leg: No edema.     Left lower leg: No edema.  Skin:    Findings: No rash.  Neurological:     Mental Status: He is alert and oriented to person, place, and time.     Motor: No weakness.  Psychiatric:        Mood and Affect: Mood normal.        Behavior: Behavior normal.     BP 137/84   Pulse 69   Ht 6\' 1"  (1.854 m)   Wt 248 lb 8 oz (112.7 kg)   BMI 32.79 kg/m  Wt Readings from Last 3 Encounters:  10/20/20 248 lb 8 oz (112.7 kg)  08/12/20 254 lb (115.2 kg)  07/22/20 253 lb 1.6 oz (114.8 kg)     Health Maintenance Due  Topic Date Due  . Hepatitis C Screening  Never done  . COVID-19 Vaccine (1) Never done  . FOOT EXAM  Never done  . OPHTHALMOLOGY EXAM  Never done  . HIV Screening  Never done  . TETANUS/TDAP  Never done  . COLONOSCOPY (Pts 45-44yrs Insurance coverage will need to be confirmed)  Never done    There are no preventive care reminders to display for this patient.  Lab Results  Component Value Date   TSH 2.97 07/20/2020   Lab Results  Component Value Date   WBC 6.3 08/12/2020   HGB 14.4 08/12/2020   HCT 42.3 08/12/2020   MCV 86.2 08/12/2020   PLT 158 08/12/2020   Lab Results  Component Value Date   NA 139 08/12/2020   K 3.5 08/12/2020   CO2 25 08/12/2020   GLUCOSE 150 (H) 08/12/2020   BUN 19 08/12/2020   CREATININE 1.12 08/12/2020   BILITOT 1.9 (H) 08/12/2020   ALKPHOS 65 08/12/2020   AST 48 (H) 08/12/2020   ALT 25 08/12/2020   PROT 7.8 08/12/2020   ALBUMIN 4.2 08/12/2020   CALCIUM 9.1 08/12/2020   ANIONGAP 12 08/12/2020   Lab Results  Component Value Date   CHOL 220 (H) 07/20/2020   Lab Results  Component Value Date   HDL 35 (L) 07/20/2020   Lab Results  Component Value Date   LDLCALC 161 (H) 07/20/2020    Lab Results  Component Value Date   TRIG 122 07/20/2020   Lab Results  Component Value Date   CHOLHDL 6.3 (H) 07/20/2020   No  results found for: HGBA1C    Assessment & Plan:   Problem List Items Addressed This Visit      Cardiovascular and Mediastinum   Hypertensive heart disease - Primary    Patient blood pressure is normal patient denies any chest pain or shortness of breath there is no history of palpitation or paroxysmal nocturnal dyspnea   patient was advised to follow low-salt low-cholesterol diet    ideally I want to keep systolic blood pressure below 765 mmHg, patient was asked to check blood pressure one times a week and give me a report on that.  Patient will be follow-up in 3 months  or earlier as needed, patient will call me back for any change in the cardiovascular symptoms         Atrial fibrillation (HCC)    Patient is in normal sinus rhythm today      Relevant Orders   POCT INR (Completed)     Endocrine   Diabetes 1.5, managed as type 2 (HCC)    - The patient's blood sugar is under control on med. - The patient will continue the current treatment regimen.  - I encouraged the patient to regularly check blood sugar.  - I encouraged the patient to monitor diet. I encouraged the patient to eat low-carb and low-sugar to help prevent blood sugar spikes.  - I encouraged the patient to continue following their prescribed treatment plan for diabetes - I informed the patient to get help if blood sugar drops below 54mg /dL, or if suddenly have trouble thinking clearly or breathing.         Other   Obesity (BMI 30-39.9)    - I encouraged the patient to lose weight.  - I educated them on making healthy dietary choices including eating more fruits and vegetables and less fried foods. - I encouraged the patient to exercise more, and educated on the benefits of exercise including weight loss, diabetes prevention, and hypertension prevention.   Dietary counseling with  a registered dietician  Referral to a weight management support group (e.g. Weight Watchers, Overeaters Anonymous)  If your BMI is greater than 29 or you have gained more than 15 pounds you should work on weight loss.  Attend a healthy cooking class          No orders of the defined types were placed in this encounter.   Follow-up: No follow-ups on file.    , MD

## 2020-10-26 ENCOUNTER — Encounter: Payer: Self-pay | Admitting: Internal Medicine

## 2020-10-26 NOTE — Assessment & Plan Note (Signed)
Patient blood pressure is normal patient denies any chest pain or shortness of breath there is no history of palpitation or paroxysmal nocturnal dyspnea   patient was advised to follow low-salt low-cholesterol diet    ideally I want to keep systolic blood pressure below 130 mmHg, patient was asked to check blood pressure one times a week and give me a report on that.  Patient will be follow-up in 3 months  or earlier as needed, patient will call me back for any change in the cardiovascular symptoms    

## 2020-10-26 NOTE — Assessment & Plan Note (Signed)

## 2020-10-26 NOTE — Assessment & Plan Note (Signed)
Patient is in normal sinus rhythm today 

## 2020-10-26 NOTE — Assessment & Plan Note (Signed)

## 2020-11-02 ENCOUNTER — Other Ambulatory Visit: Payer: Self-pay | Admitting: Internal Medicine

## 2020-11-22 ENCOUNTER — Encounter: Payer: Self-pay | Admitting: Internal Medicine

## 2020-11-22 ENCOUNTER — Ambulatory Visit (INDEPENDENT_AMBULATORY_CARE_PROVIDER_SITE_OTHER): Payer: Self-pay | Admitting: Internal Medicine

## 2020-11-22 ENCOUNTER — Other Ambulatory Visit: Payer: Self-pay

## 2020-11-22 VITALS — BP 140/82 | HR 64 | Ht 73.0 in | Wt 244.9 lb

## 2020-11-22 DIAGNOSIS — E1169 Type 2 diabetes mellitus with other specified complication: Secondary | ICD-10-CM

## 2020-11-22 DIAGNOSIS — E139 Other specified diabetes mellitus without complications: Secondary | ICD-10-CM

## 2020-11-22 DIAGNOSIS — I119 Hypertensive heart disease without heart failure: Secondary | ICD-10-CM

## 2020-11-22 DIAGNOSIS — E669 Obesity, unspecified: Secondary | ICD-10-CM

## 2020-11-22 DIAGNOSIS — I4891 Unspecified atrial fibrillation: Secondary | ICD-10-CM

## 2020-11-22 LAB — POCT INR: INR: 3.3 — AB (ref 2.0–3.0)

## 2020-11-22 LAB — POCT GLYCOSYLATED HEMOGLOBIN (HGB A1C): HbA1c POC (<> result, manual entry): 10 % (ref 4.0–5.6)

## 2020-11-22 LAB — GLUCOSE, POCT (MANUAL RESULT ENTRY): POC Glucose: 186 mg/dl — AB (ref 70–99)

## 2020-11-22 NOTE — Assessment & Plan Note (Signed)

## 2020-11-22 NOTE — Assessment & Plan Note (Signed)

## 2020-11-22 NOTE — Progress Notes (Signed)
Established Patient Office Visit  Subjective:  Patient ID: Jesse Humphrey, male    DOB: Mar 26, 1956  Age: 65 y.o. MRN: [id number]  CC:  Chief Complaint  Patient presents with  . Diabetes  . Hypertension  . Atrial Fibrillation    HPI  Jesse Humphrey presents for checkup of blood pressure uncontrolled diabetes.  Back problems also discussed with the patient.  He denies any chest pain or shortness of breath.  Does not drink.  He exercises on regular basis.  Blood sugar and blood pressure is labile.  Atrial fibrillation is under control. Past Medical History:  Diagnosis Date  . Atrial fibrillation (HCC)   . Diabetes mellitus without complication (HCC)   . Hypertension     History reviewed. No pertinent surgical history.  History reviewed. No pertinent family history.  Social History   Socioeconomic History  . Marital status: Single    Spouse name: Not on file  . Number of children: Not on file  . Years of education: Not on file  . Highest education level: Not on file  Occupational History  . Not on file  Tobacco Use  . Smoking status: Never Smoker  . Smokeless tobacco: Never Used  Substance and Sexual Activity  . Alcohol use: Never  . Drug use: Never  . Sexual activity: Not on file  Other Topics Concern  . Not on file  Social History Narrative  . Not on file   Social Determinants of Health   Financial Resource Strain: Not on file  Food Insecurity: Not on file  Transportation Needs: Not on file  Physical Activity: Not on file  Stress: Not on file  Social Connections: Not on file  Intimate Partner Violence: Not on file     Current Outpatient Medications:  .  cloNIDine (CATAPRES) 0.2 MG tablet, Take 1 tablet (0.2 mg total) by mouth 2 (two) times daily., Disp: 60 tablet, Rfl: 4 .  doxazosin (CARDURA) 4 MG tablet, Take 1 tablet (4 mg total) by mouth daily., Disp: 30 tablet, Rfl: 4 .  furosemide (LASIX) 20 MG tablet, TAKE ONE TABLET BY MOUTH DAILY, Disp: 90  tablet, Rfl: 1 .  glipiZIDE (GLUCOTROL XL) 10 MG 24 hr tablet, Take 1 tablet (10 mg total) by mouth daily., Disp: 90 tablet, Rfl: 3 .  loratadine (CLARITIN) 10 MG tablet, Take 10 mg by mouth daily., Disp: , Rfl:  .  mupirocin ointment (BACTROBAN) 2 %, Apply 1 application topically 2 (two) times daily., Disp: 22 g, Rfl: 4 .  olmesartan (BENICAR) 40 MG tablet, Take 1 tablet (40 mg total) by mouth daily., Disp: 90 tablet, Rfl: 3 .  pioglitazone (ACTOS) 45 MG tablet, Take 1 tablet (45 mg total) by mouth daily., Disp: 90 tablet, Rfl: 3 .  warfarin (COUMADIN) 5 MG tablet, Take 5 mg by mouth daily., Disp: , Rfl:   Current Facility-Administered Medications:  .  aspirin chewable tablet 324 mg, 324 mg, Oral, Once, Irish Lack, FNP   No Known Allergies  ROS Review of Systems  Constitutional: Negative.   HENT: Negative.   Eyes: Negative.   Respiratory: Negative.   Cardiovascular: Negative.   Gastrointestinal: Negative.   Endocrine: Negative.   Genitourinary: Negative.   Musculoskeletal: Negative.   Skin: Negative.   Allergic/Immunologic: Negative.   Neurological: Negative.   Hematological: Negative.   Psychiatric/Behavioral: Negative.   All other systems reviewed and are negative.     Objective:    Physical Exam Vitals reviewed.  Constitutional:  Appearance: Normal appearance.  HENT:     Mouth/Throat:     Mouth: Mucous membranes are moist.  Eyes:     Pupils: Pupils are equal, round, and reactive to light.  Neck:     Vascular: No carotid bruit.  Cardiovascular:     Rate and Rhythm: Normal rate and regular rhythm.     Pulses: Normal pulses.     Heart sounds: Normal heart sounds.  Pulmonary:     Effort: Pulmonary effort is normal.     Breath sounds: Normal breath sounds.  Abdominal:     General: Bowel sounds are normal.     Palpations: Abdomen is soft. There is no hepatomegaly, splenomegaly or mass.     Tenderness: There is no abdominal tenderness.     Hernia: No  hernia is present.  Musculoskeletal:     Cervical back: Neck supple.     Right lower leg: No edema.     Left lower leg: No edema.  Skin:    Findings: No rash.  Neurological:     Mental Status: He is alert and oriented to person, place, and time.     Motor: No weakness.  Psychiatric:        Mood and Affect: Mood normal.        Behavior: Behavior normal.     BP 140/82   Pulse 64   Ht 6\' 1"  (1.854 m)   Wt 244 lb 14.4 oz (111.1 kg)   BMI 32.31 kg/m  Wt Readings from Last 3 Encounters:  11/22/20 244 lb 14.4 oz (111.1 kg)  10/20/20 248 lb 8 oz (112.7 kg)  08/12/20 254 lb (115.2 kg)     Health Maintenance Due  Topic Date Due  . COVID-19 Vaccine (1) Never done  . FOOT EXAM  Never done  . OPHTHALMOLOGY EXAM  Never done  . HIV Screening  Never done  . Hepatitis C Screening  Never done  . TETANUS/TDAP  Never done  . COLONOSCOPY (Pts 45-22yrs Insurance coverage will need to be confirmed)  Never done    There are no preventive care reminders to display for this patient.  Lab Results  Component Value Date   TSH 2.97 07/20/2020   Lab Results  Component Value Date   WBC 6.3 08/12/2020   HGB 14.4 08/12/2020   HCT 42.3 08/12/2020   MCV 86.2 08/12/2020   PLT 158 08/12/2020   Lab Results  Component Value Date   NA 139 08/12/2020   K 3.5 08/12/2020   CO2 25 08/12/2020   GLUCOSE 150 (H) 08/12/2020   BUN 19 08/12/2020   CREATININE 1.12 08/12/2020   BILITOT 1.9 (H) 08/12/2020   ALKPHOS 65 08/12/2020   AST 48 (H) 08/12/2020   ALT 25 08/12/2020   PROT 7.8 08/12/2020   ALBUMIN 4.2 08/12/2020   CALCIUM 9.1 08/12/2020   ANIONGAP 12 08/12/2020   Lab Results  Component Value Date   CHOL 220 (H) 07/20/2020   Lab Results  Component Value Date   HDL 35 (L) 07/20/2020   Lab Results  Component Value Date   LDLCALC 161 (H) 07/20/2020   Lab Results  Component Value Date   TRIG 122 07/20/2020   Lab Results  Component Value Date   CHOLHDL 6.3 (H) 07/20/2020   Lab  Results  Component Value Date   HGBA1C 10.0 11/22/2020      Assessment & Plan:   Problem List Items Addressed This Visit      Cardiovascular and Mediastinum  Hypertensive heart disease    Patient blood pressure is normal patient denies any chest pain or shortness of breath there is no history of palpitation or paroxysmal nocturnal dyspnea   patient was advised to follow low-salt low-cholesterol diet    ideally I want to keep systolic blood pressure below 428 mmHg, patient was asked to check blood pressure one times a week and give me a report on that.  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 - The patient will continue the current treatment regimen.  - I encouraged the patient to regularly check blood sugar.  - I encouraged the patient to monitor diet. I encouraged the patient to eat low-carb and low-sugar to help prevent blood sugar spikes.  - I encouraged the patient to continue following their prescribed treatment plan for diabetes - I informed the patient to get help if blood sugar drops below 54mg /dL, or if suddenly have trouble thinking clearly or breathing.       Relevant Orders   POCT glucose (manual entry) (Completed)   POCT glycosylated hemoglobin (Hb A1C) (Completed)     Other   Obesity (BMI 30-39.9)    - I encouraged the patient to lose weight.  - I educated them on making healthy dietary choices including eating more fruits and vegetables and less fried foods. - I encouraged the patient to exercise more, and educated on the  benefits of exercise including weight loss, diabetes prevention, and hypertension prevention.   Dietary counseling with a registered dietician  Referral to a weight management support group (e.g. Weight Watchers, Overeaters Anonymous)  If your BMI is greater than 29 or you have gained more than 15 pounds you should work on weight loss.  Attend a healthy cooking class          No orders of the defined types were placed in this encounter.   Follow-up: No follow-ups on file.    , MD                                                     Plan:

## 2020-11-22 NOTE — Assessment & Plan Note (Signed)
No further runs of tachycardia or atrial fibrillation, pro time is supratherapeutic so I decreased Coumadin 2.5 mg alternating with 5 mg daily

## 2020-11-22 NOTE — Assessment & Plan Note (Signed)
Patient blood pressure is normal patient denies any chest pain or shortness of breath there is no history of palpitation or paroxysmal nocturnal dyspnea   patient was advised to follow low-salt low-cholesterol diet    ideally I want to keep systolic blood pressure below 130 mmHg, patient was asked to check blood pressure one times a week and give me a report on that.  Patient will be follow-up in 3 months  or earlier as needed, patient will call me back for any change in the cardiovascular symptoms    

## 2020-11-22 NOTE — Patient Instructions (Signed)
 Hypertension, Adult High blood pressure (hypertension) is when the force of blood pumping through the arteries is too strong. The arteries are the blood vessels that carry blood from the heart throughout the body. Hypertension forces the heart to work harder to pump blood and may cause arteries to become narrow or stiff. Untreated or uncontrolled hypertension can cause a heart attack, heart failure, a stroke, kidney disease, and other problems. A blood pressure reading consists of a higher number over a lower number. Ideally, your blood pressure should be below 120/80. The first ("top") number is called the systolic pressure. It is a measure of the pressure in your arteries as your heart beats. The second ("bottom") number is called the diastolic pressure. It is a measure of the pressure in your arteries as the heart relaxes. What are the causes? The exact cause of this condition is not known. There are some conditions that result in or are related to high blood pressure. What increases the risk? Some risk factors for high blood pressure are under your control. The following factors may make you more likely to develop this condition:  Smoking.  Having type 2 diabetes mellitus, high cholesterol, or both.  Not getting enough exercise or physical activity.  Being overweight.  Having too much fat, sugar, calories, or salt (sodium) in your diet.  Drinking too much alcohol. Some risk factors for high blood pressure may be difficult or impossible to change. Some of these factors include:  Having chronic kidney disease.  Having a family history of high blood pressure.  Age. Risk increases with age.  Race. You may be at higher risk if you are African American.  Gender. Men are at higher risk than women before age 45. After age 65, women are at higher risk than men.  Having obstructive sleep apnea.  Stress. What are the signs or symptoms? High blood pressure may not cause symptoms. Very  high blood pressure (hypertensive crisis) may cause:  Headache.  Anxiety.  Shortness of breath.  Nosebleed.  Nausea and vomiting.  Vision changes.  Severe chest pain.  Seizures. How is this diagnosed? This condition is diagnosed by measuring your blood pressure while you are seated, with your arm resting on a flat surface, your legs uncrossed, and your feet flat on the floor. The cuff of the blood pressure monitor will be placed directly against the skin of your upper arm at the level of your heart. It should be measured at least twice using the same arm. Certain conditions can cause a difference in blood pressure between your right and left arms. Certain factors can cause blood pressure readings to be lower or higher than normal for a short period of time:  When your blood pressure is higher when you are in a health care provider's office than when you are at home, this is called white coat hypertension. Most people with this condition do not need medicines.  When your blood pressure is higher at home than when you are in a health care provider's office, this is called masked hypertension. Most people with this condition may need medicines to control blood pressure. If you have a high blood pressure reading during one visit or you have normal blood pressure with other risk factors, you may be asked to:  Return on a different day to have your blood pressure checked again.  Monitor your blood pressure at home for 1 week or longer. If you are diagnosed with hypertension, you may have other blood   or imaging tests to help your health care provider understand your overall risk for other conditions. How is this treated? This condition is treated by making healthy lifestyle changes, such as eating healthy foods, exercising more, and reducing your alcohol intake. Your health care provider may prescribe medicine if lifestyle changes are not enough to get your blood pressure under control, and  if:  Your systolic blood pressure is above 130.  Your diastolic blood pressure is above 80. Your personal target blood pressure may vary depending on your medical conditions, your age, and other factors. Follow these instructions at home: Eating and drinking  Eat a diet that is high in fiber and potassium, and low in sodium, added sugar, and fat. An example eating plan is called the DASH (Dietary Approaches to Stop Hypertension) diet. To eat this way: ? Eat plenty of fresh fruits and vegetables. Try to fill one half of your plate at each meal with fruits and vegetables. ? Eat whole grains, such as whole-wheat pasta, brown rice, or whole-grain bread. Fill about one fourth of your plate with whole grains. ? Eat or drink low-fat dairy products, such as skim milk or low-fat yogurt. ? Avoid fatty cuts of meat, processed or cured meats, and poultry with skin. Fill about one fourth of your plate with lean proteins, such as fish, chicken without skin, beans, eggs, or tofu. ? Avoid pre-made and processed foods. These tend to be higher in sodium, added sugar, and fat.  Reduce your daily sodium intake. Most people with hypertension should eat less than 1,500 mg of sodium a day.  Do not drink alcohol if: ? Your health care provider tells you not to drink. ? You are pregnant, may be pregnant, or are planning to become pregnant.  If you drink alcohol: ? Limit how much you use to:  0-1 drink a day for women.  0-2 drinks a day for men. ? Be aware of how much alcohol is in your drink. In the [place] one drink equals one 12 oz bottle of beer (355 mL), one 5 oz glass of wine (148 mL), or one 1 oz glass of hard liquor (44 mL).   Lifestyle  Work with your health care provider to maintain a healthy body weight or to lose weight. Ask what an ideal weight is for you.  Get at least 30 minutes of exercise most days of the week. Activities may include walking, swimming, or biking.  Include exercise to  strengthen your muscles (resistance exercise), such as Pilates or lifting weights, as part of your weekly exercise routine. Try to do these types of exercises for 30 minutes at least 3 days a week.  Do not use any products that contain nicotine or tobacco, such as cigarettes, e-cigarettes, and chewing tobacco. If you need help quitting, ask your health care provider.  Monitor your blood pressure at home as told by your health care provider.  Keep all follow-up visits as told by your health care provider. This is important.   Medicines  Take over-the-counter and prescription medicines only as told by your health care provider. Follow directions carefully. Blood pressure medicines must be taken as prescribed.  Do not skip doses of blood pressure medicine. Doing this puts you at risk for problems and can make the medicine less effective.  Ask your health care provider about side effects or reactions to medicines that you should watch for. Contact a health care provider if you:  Think you are having a reaction to   a medicine you are taking.  Have headaches that keep coming back (recurring).  Feel dizzy.  Have swelling in your ankles.  Have trouble with your vision. Get help right away if you:  Develop a severe headache or confusion.  Have unusual weakness or numbness.  Feel faint.  Have severe pain in your chest or abdomen.  Vomit repeatedly.  Have trouble breathing. Summary  Hypertension is when the force of blood pumping through your arteries is too strong. If this condition is not controlled, it may put you at risk for serious complications.  Your personal target blood pressure may vary depending on your medical conditions, your age, and other factors. For most people, a normal blood pressure is less than 120/80.  Hypertension is treated with lifestyle changes, medicines, or a combination of both. Lifestyle changes include losing weight, eating a healthy, low-sodium diet,  exercising more, and limiting alcohol. This information is not intended to replace advice given to you by your health care provider. Make sure you discuss any questions you have with your health care provider. Document Revised: 02/26/2018 Document Reviewed: 02/26/2018 Elsevier Patient Education  2021 [employer]     Why follow it? Research shows. . Those who follow the Mediterranean diet have a reduced risk of heart disease  . The diet is associated with a reduced incidence of Parkinson's and Alzheimer's diseases . People following the diet may have longer life expectancies and lower rates of chronic diseases  . The Dietary Guidelines for Americans recommends the Mediterranean diet as an eating plan to promote health and prevent disease  What Is the Mediterranean Diet?  . Healthy eating plan based on typical foods and recipes of Mediterranean-style cooking . The diet is primarily a plant based diet; these foods should make up a majority of meals   Starches - Plant based foods should make up a majority of meals - They are an important sources of vitamins, minerals, energy, antioxidants, and fiber - Choose whole grains, foods high in fiber and minimally processed items  - Typical grain sources include wheat, oats, barley, corn, brown rice, bulgar, farro, millet, polenta, couscous  - Various types of beans include chickpeas, lentils, fava beans, black beans, white beans   Fruits  Veggies - Large quantities of antioxidant rich fruits & veggies; 6 or more servings  - Vegetables can be eaten raw or lightly drizzled with oil and cooked  - Vegetables common to the traditional Mediterranean Diet include: artichokes, arugula, beets, broccoli, brussel sprouts, cabbage, carrots, celery, collard greens, cucumbers, eggplant, kale, leeks, lemons, lettuce, mushrooms, okra, onions, peas, peppers, potatoes, pumpkin, radishes, rutabaga, shallots, spinach, sweet potatoes, turnips, zucchini - Fruits common to  the Mediterranean Diet include: apples, apricots, avocados, cherries, clementines, dates, figs, grapefruits, grapes, melons, nectarines, oranges, peaches, pears, pomegranates, strawberries, tangerines  Fats - Replace butter and margarine with healthy oils, such as olive oil, canola oil, and tahini  - Limit nuts to no more than a handful a day  - Nuts include walnuts, almonds, pecans, pistachios, pine nuts  - Limit or avoid candied, honey roasted or heavily salted nuts - Olives are central to the Mediterranean diet - can be eaten whole or used in a variety of dishes   Meats Protein - Limiting red meat: no more than a few times a month - When eating red meat: choose lean cuts and keep the portion to the size of deck of cards - Eggs: approx. 0 to 4 times a week  - Fish   and lean poultry: at least 2 a week  - Healthy protein sources include, chicken, turkey, lean beef, lamb - Increase intake of seafood such as tuna, salmon, trout, mackerel, shrimp, scallops - Avoid or limit high fat processed meats such as sausage and bacon  Dairy - Include moderate amounts of low fat dairy products  - Focus on healthy dairy such as fat free yogurt, skim milk, low or reduced fat cheese - Limit dairy products higher in fat such as whole or 2% milk, cheese, ice cream  Alcohol - Moderate amounts of red wine is ok  - No more than 5 oz daily for women (all ages) and men older than age 65  - No more than 10 oz of wine daily for men younger than 65  Other - Limit sweets and other desserts  - Use herbs and spices instead of salt to flavor foods  - Herbs and spices common to the traditional Mediterranean Diet include: basil, bay leaves, chives, cloves, cumin, fennel, garlic, lavender, marjoram, mint, oregano, parsley, pepper, rosemary, sage, savory, sumac, tarragon, thyme   It's not just a diet, it's a lifestyle:  . The Mediterranean diet includes lifestyle factors typical of those in the region  . Foods, drinks and meals  are best eaten with others and savored . Daily physical activity is important for overall good health . This could be strenuous exercise like running and aerobics . This could also be more leisurely activities such as walking, housework, yard-work, or taking the stairs . Moderation is the key; a balanced and healthy diet accommodates most foods and drinks . Consider portion sizes and frequency of consumption of certain foods   Meal Ideas & Options:  . Breakfast:  o Whole wheat toast or whole wheat English muffins with peanut butter & hard boiled egg o Steel cut oats topped with apples & cinnamon and skim milk  o Fresh fruit: banana, strawberries, melon, berries, peaches  o Smoothies: strawberries, bananas, greek yogurt, peanut butter o Low fat greek yogurt with blueberries and granola  o Egg white omelet with spinach and mushrooms o Breakfast couscous: whole wheat couscous, apricots, skim milk, cranberries  . Sandwiches:  o Hummus and grilled vegetables (peppers, zucchini, squash) on whole wheat bread   o Grilled chicken on whole wheat pita with lettuce, tomatoes, cucumbers or tzatziki  o Tuna salad on whole wheat bread: tuna salad made with greek yogurt, olives, red peppers, capers, green onions o Garlic rosemary lamb pita: lamb sauted with garlic, rosemary, salt & pepper; add lettuce, cucumber, greek yogurt to pita - flavor with lemon juice and black pepper  . Seafood:  o Mediterranean grilled salmon, seasoned with garlic, basil, parsley, lemon juice and black pepper o Shrimp, lemon, and spinach whole-grain pasta salad made with low fat greek yogurt  o Seared scallops with lemon orzo  o Seared tuna steaks seasoned salt, pepper, coriander topped with tomato mixture of olives, tomatoes, olive oil, minced garlic, parsley, green onions and cappers  . Meats:  o Herbed greek chicken salad with kalamata olives, cucumber, feta  o Red bell peppers stuffed with spinach, bulgur, lean ground beef  (or lentils) & topped with feta   o Kebabs: skewers of chicken, tomatoes, onions, zucchini, squash  o Turkey burgers: made with red onions, mint, dill, lemon juice, feta cheese topped with roasted red peppers . Vegetarian o Cucumber salad: cucumbers, artichoke hearts, celery, red onion, feta cheese, tossed in olive oil & lemon juice  o Hummus and   whole grain pita points with a greek salad (lettuce, tomato, feta, olives, cucumbers, red onion) o Lentil soup with celery, carrots made with vegetable broth, garlic, salt and pepper  o Tabouli salad: parsley, bulgur, mint, scallions, cucumbers, tomato, radishes, lemon juice, olive oil, salt and pepper.      

## 2020-12-13 ENCOUNTER — Other Ambulatory Visit: Payer: Self-pay | Admitting: Internal Medicine

## 2021-01-19 ENCOUNTER — Other Ambulatory Visit: Payer: Self-pay | Admitting: *Deleted

## 2021-01-19 MED ORDER — DIGOXIN 250 MCG PO TABS: 250.0000 ug | ORAL_TABLET | Freq: Every day | ORAL | 3 refills | Status: DC

## 2021-01-23 ENCOUNTER — Other Ambulatory Visit
Admission: RE | Admit: 2021-01-23 | Discharge: 2021-01-23 | Disposition: A | Discharge status: Home / Self Care | Payer: Self-pay | Source: Ambulatory Visit | Attending: Internal Medicine | Admitting: Internal Medicine

## 2021-01-23 ENCOUNTER — Other Ambulatory Visit: Payer: Self-pay

## 2021-01-23 ENCOUNTER — Ambulatory Visit (INDEPENDENT_AMBULATORY_CARE_PROVIDER_SITE_OTHER): Payer: Self-pay | Admitting: Internal Medicine

## 2021-01-23 ENCOUNTER — Encounter: Payer: Self-pay | Admitting: Internal Medicine

## 2021-01-23 VITALS — BP 110/60 | HR 69 | Ht 73.0 in | Wt 233.1 lb

## 2021-01-23 DIAGNOSIS — E669 Obesity, unspecified: Secondary | ICD-10-CM

## 2021-01-23 DIAGNOSIS — I119 Hypertensive heart disease without heart failure: Secondary | ICD-10-CM

## 2021-01-23 DIAGNOSIS — E139 Other specified diabetes mellitus without complications: Secondary | ICD-10-CM

## 2021-01-23 DIAGNOSIS — I4891 Unspecified atrial fibrillation: Secondary | ICD-10-CM

## 2021-01-23 NOTE — Progress Notes (Signed)
Established Patient Office Visit  Subjective:  Patient ID: Jesse Humphrey, male    DOB: [birth year]  Age: 65 y.o. MRN: [id number]  CC:  Chief Complaint  Patient presents with   Atrial Fibrillation   Diabetes   Hypertension    Atrial Fibrillation Symptoms include hypertension. Past medical history includes atrial fibrillation.  Diabetes  Hypertension   Jesse Humphrey presents for pro time and BP check  Past Medical History:  Diagnosis Date   Atrial fibrillation (HCC)    Diabetes mellitus without complication (HCC)    Hypertension     History reviewed. No pertinent surgical history.  History reviewed. No pertinent family history.  Social History   Socioeconomic History   Marital status: Single    Spouse name: Not on file   Number of children: Not on file   Years of education: Not on file   Highest education level: Not on file  Occupational History   Not on file  Tobacco Use   Smoking status: Never   Smokeless tobacco: Never  Substance and Sexual Activity   Alcohol use: Never   Drug use: Never   Sexual activity: Not on file  Other Topics Concern   Not on file  Social History Narrative   Not on file   Social Determinants of Health   Financial Resource Strain: Not on file  Food Insecurity: Not on file  Transportation Needs: Not on file  Physical Activity: Not on file  Stress: Not on file  Social Connections: Not on file  Intimate Partner Violence: Not on file     Current Outpatient Medications:    cloNIDine (CATAPRES) 0.2 MG tablet, Take 1 tablet (0.2 mg total) by mouth 2 (two) times daily., Disp: 60 tablet, Rfl: 4   digoxin (LANOXIN) 0.25 MG tablet, Take 1 tablet (250 mcg total) by mouth daily., Disp: 90 tablet, Rfl: 3   doxazosin (CARDURA) 4 MG tablet, TAKE ONE TABLET BY MOUTH DAILY, Disp: 30 tablet, Rfl: 4   furosemide (LASIX) 20 MG tablet, TAKE ONE TABLET BY MOUTH DAILY, Disp: 90 tablet, Rfl: 1   glipiZIDE (GLUCOTROL XL) 10 MG 24 hr tablet, Take  1 tablet (10 mg total) by mouth daily., Disp: 90 tablet, Rfl: 3   loratadine (CLARITIN) 10 MG tablet, Take 10 mg by mouth daily., Disp: , Rfl:    mupirocin ointment (BACTROBAN) 2 %, Apply 1 application topically 2 (two) times daily., Disp: 22 g, Rfl: 4   olmesartan (BENICAR) 40 MG tablet, Take 1 tablet (40 mg total) by mouth daily., Disp: 90 tablet, Rfl: 3   pioglitazone (ACTOS) 45 MG tablet, Take 1 tablet (45 mg total) by mouth daily., Disp: 90 tablet, Rfl: 3   warfarin (COUMADIN) 5 MG tablet, TAKE ONE TABLET BY MOUTH DAILY, Disp: 90 tablet, Rfl: 2  Current Facility-Administered Medications:    aspirin chewable tablet 324 mg, 324 mg, Oral, Once, Irish Lack, FNP   No Known Allergies  ROS Review of Systems  Constitutional: Negative.   HENT: Negative.    Eyes: Negative.   Respiratory: Negative.    Cardiovascular: Negative.   Gastrointestinal: Negative.   Endocrine: Negative.   Genitourinary: Negative.   Musculoskeletal: Negative.   Skin: Negative.   Allergic/Immunologic: Negative.   Neurological: Negative.   Hematological: Negative.   Psychiatric/Behavioral: Negative.    All other systems reviewed and are negative.    Objective:    Physical Exam Vitals reviewed.  Constitutional:      Appearance: Normal appearance.  HENT:  Mouth/Throat:     Mouth: Mucous membranes are moist.  Eyes:     Pupils: Pupils are equal, round, and reactive to light.  Neck:     Vascular: No carotid bruit.  Cardiovascular:     Rate and Rhythm: Normal rate and regular rhythm.     Pulses: Normal pulses.     Heart sounds: Normal heart sounds.  Pulmonary:     Effort: Pulmonary effort is normal.     Breath sounds: Normal breath sounds.  Abdominal:     General: Bowel sounds are normal.     Palpations: Abdomen is soft. There is no hepatomegaly, splenomegaly or mass.     Tenderness: There is no abdominal tenderness.     Hernia: No hernia is present.  Musculoskeletal:     Cervical back: Neck  supple.     Right lower leg: No edema.     Left lower leg: No edema.  Skin:    Findings: No rash.  Neurological:     Mental Status: He is alert and oriented to person, place, and time.     Motor: No weakness.  Psychiatric:        Mood and Affect: Mood normal.        Behavior: Behavior normal.    BP 110/60   Pulse 69   Ht 6\' 1"  (1.854 m)   Wt 233 lb 1.6 oz (105.7 kg)   BMI 30.75 kg/m  Wt Readings from Last 3 Encounters:  01/23/21 233 lb 1.6 oz (105.7 kg)  11/22/20 244 lb 14.4 oz (111.1 kg)  10/20/20 248 lb 8 oz (112.7 kg)     Health Maintenance Due  Topic Date Due   COVID-19 Vaccine (1) Never done   FOOT EXAM  Never done   OPHTHALMOLOGY EXAM  Never done   HIV Screening  Never done   Hepatitis C Screening  Never done   TETANUS/TDAP  Never done   COLONOSCOPY (Pts 45-36yrs Insurance coverage will need to be confirmed)  Never done   Zoster Vaccines- Shingrix (1 of 2) Never done    There are no preventive care reminders to display for this patient.  Lab Results  Component Value Date   TSH 2.97 07/20/2020   Lab Results  Component Value Date   WBC 6.3 08/12/2020   HGB 14.4 08/12/2020   HCT 42.3 08/12/2020   MCV 86.2 08/12/2020   PLT 158 08/12/2020   Lab Results  Component Value Date   NA 139 08/12/2020   K 3.5 08/12/2020   CO2 25 08/12/2020   GLUCOSE 150 (H) 08/12/2020   BUN 19 08/12/2020   CREATININE 1.12 08/12/2020   BILITOT 1.9 (H) 08/12/2020   ALKPHOS 65 08/12/2020   AST 48 (H) 08/12/2020   ALT 25 08/12/2020   PROT 7.8 08/12/2020   ALBUMIN 4.2 08/12/2020   CALCIUM 9.1 08/12/2020   ANIONGAP 12 08/12/2020   Lab Results  Component Value Date   CHOL 220 (H) 07/20/2020   Lab Results  Component Value Date   HDL 35 (L) 07/20/2020   Lab Results  Component Value Date   LDLCALC 161 (H) 07/20/2020   Lab Results  Component Value Date   TRIG 122 07/20/2020   Lab Results  Component Value Date   CHOLHDL 6.3 (H) 07/20/2020   Lab Results  Component  Value Date   HGBA1C 10.0 11/22/2020      Assessment & Plan:   Problem List Items Addressed This Visit  Cardiovascular and Mediastinum   Hypertensive heart disease     Patient denies any chest pain or shortness of breath there is no history of palpitation or paroxysmal nocturnal dyspnea   patient was advised to follow low-salt low-cholesterol diet    ideally I want to keep systolic blood pressure below 081 mmHg, patient was asked to check blood pressure one times a week and give me a report on that.  Patient will be follow-up in 3 months  or earlier as needed, patient will call me back for any change in the cardiovascular symptoms Patient was advised to buy a book from local bookstore concerning blood pressure and read several chapters  every day.  This will be supplemented by some of the material we will give him from the office.  Patient should also utilize other resources like YouTube and Internet to learn more about the blood pressure and the diet.       Atrial fibrillation (HCC) - Primary    Under control.  Patient is on Coumadin, I will increase the Coumadin to 5 mg alternating with 2.5 mg p.o. daily       Relevant Orders   POCT INR     Endocrine   Diabetes 1.5, managed as type 2 (HCC)    - The patient's blood sugar is labile on med. - The patient will continue the current treatment regimen.  - I encouraged the patient to regularly check blood sugar.  - I encouraged the patient to monitor diet. I encouraged the patient to eat low-carb and low-sugar to help prevent blood sugar spikes.  - I encouraged the patient to continue following their prescribed treatment plan for diabetes - I informed the patient to get help if blood sugar drops below 54mg /dL, or if suddenly have trouble thinking clearly or breathing.  Patient was advised to buy a book on diabetes from a local bookstore or from .  Patient should read 2 chapters every day to keep the motivation going, this is  in addition to some of the materials we provided them from the office.  There are other resources on the Internet like YouTube and wilkipedia to get an education on the diabetes         Other   Obesity (BMI 30-39.9)    - I encouraged the patient to lose weight.  - I educated them on making healthy dietary choices including eating more fruits and vegetables and less fried foods. - I encouraged the patient to exercise more, and educated on the benefits of exercise including weight loss, diabetes prevention, and hypertension prevention.   Dietary counseling with a registered dietician  Referral to a weight management support group (e.g. Weight Watchers, Overeaters Anonymous)  If your BMI is greater than 29 or you have gained more than 15 pounds you should work on weight loss.  Attend a healthy cooking class         No orders of the defined types were placed in this encounter.   Follow-up: No follow-ups on file.  Patient was advised to take Coumadin 5 mg alternating with 2.5 p.o. daily  [place], MD

## 2021-01-23 NOTE — Assessment & Plan Note (Signed)

## 2021-01-23 NOTE — Assessment & Plan Note (Signed)
Under control.  Patient is on Coumadin, I will increase the Coumadin to 5 mg alternating with 2.5 mg p.o. daily

## 2021-01-23 NOTE — Assessment & Plan Note (Signed)

## 2021-01-23 NOTE — Assessment & Plan Note (Signed)

## 2021-01-24 ENCOUNTER — Ambulatory Visit (INDEPENDENT_AMBULATORY_CARE_PROVIDER_SITE_OTHER): Payer: Self-pay | Admitting: Internal Medicine

## 2021-01-24 DIAGNOSIS — E139 Other specified diabetes mellitus without complications: Secondary | ICD-10-CM

## 2021-01-24 DIAGNOSIS — I119 Hypertensive heart disease without heart failure: Secondary | ICD-10-CM

## 2021-01-24 LAB — POCT INR: INR: 1.8 — AB (ref 2.0–3.0)

## 2021-01-25 LAB — COMPLETE METABOLIC PANEL WITH GFR
AG Ratio: 1.5 (calc) (ref 1.0–2.5)
ALT: 13 U/L (ref 9–46)
AST: 17 U/L (ref 10–35)
Albumin: 4.1 g/dL (ref 3.6–5.1)
Alkaline phosphatase (APISO): 76 U/L (ref 35–144)
BUN/Creatinine Ratio: 14 (calc) (ref 6–22)
BUN: 20 mg/dL (ref 7–25)
CO2: 27 mmol/L (ref 20–32)
Calcium: 9.1 mg/dL (ref 8.6–10.3)
Chloride: 98 mmol/L (ref 98–110)
Creat: 1.38 mg/dL — ABNORMAL HIGH (ref 0.70–1.35)
Globulin: 2.7 g/dL (calc) (ref 1.9–3.7)
Glucose, Bld: 395 mg/dL — ABNORMAL HIGH (ref 65–99)
Potassium: 4 mmol/L (ref 3.5–5.3)
Sodium: 135 mmol/L (ref 135–146)
Total Bilirubin: 1.3 mg/dL — ABNORMAL HIGH (ref 0.2–1.2)
Total Protein: 6.8 g/dL (ref 6.1–8.1)
eGFR: 57 mL/min/{1.73_m2} — ABNORMAL LOW (ref >=60)

## 2021-02-22 ENCOUNTER — Encounter: Payer: Self-pay | Admitting: Internal Medicine

## 2021-02-22 ENCOUNTER — Other Ambulatory Visit: Payer: Self-pay

## 2021-02-22 ENCOUNTER — Ambulatory Visit (INDEPENDENT_AMBULATORY_CARE_PROVIDER_SITE_OTHER): Payer: Self-pay | Admitting: Internal Medicine

## 2021-02-22 VITALS — BP 150/88 | HR 78 | Ht 73.0 in | Wt 236.4 lb

## 2021-02-22 DIAGNOSIS — I4891 Unspecified atrial fibrillation: Secondary | ICD-10-CM

## 2021-02-22 DIAGNOSIS — I119 Hypertensive heart disease without heart failure: Secondary | ICD-10-CM

## 2021-02-22 DIAGNOSIS — E139 Other specified diabetes mellitus without complications: Secondary | ICD-10-CM

## 2021-02-22 DIAGNOSIS — E669 Obesity, unspecified: Secondary | ICD-10-CM

## 2021-02-22 LAB — POCT INR: INR: 2.4 (ref 2.0–3.0)

## 2021-02-22 NOTE — Progress Notes (Signed)
Established Patient Office Visit  Subjective:  Patient ID: Jesse Humphrey, male    DOB: Apr 14, 1956  Age: 65 y.o. MRN: [id number]  CC:  Chief Complaint  Patient presents with   Atrial Fibrillation    HPI  Jesse Humphrey presents for pro time check Past Medical History:  Diagnosis Date   Atrial fibrillation ([place])    Diabetes mellitus without complication ([place])    Hypertension     History reviewed. No pertinent surgical history.  History reviewed. No pertinent family history.  Social History   Socioeconomic History   Marital status: Single    Spouse name: Not on file   Number of children: Not on file   Years of education: Not on file   Highest education level: Not on file  Occupational History   Not on file  Tobacco Use   Smoking status: Never   Smokeless tobacco: Never  Substance and Sexual Activity   Alcohol use: Never   Drug use: Never   Sexual activity: Not on file  Other Topics Concern   Not on file  Social History Narrative   Not on file   Social Determinants of Health   Financial Resource Strain: Not on file  Food Insecurity: Not on file  Transportation Needs: Not on file  Physical Activity: Not on file  Stress: Not on file  Social Connections: Not on file  Intimate Partner Violence: Not on file     Current Outpatient Medications:    cloNIDine (CATAPRES) 0.2 MG tablet, Take 1 tablet (0.2 mg total) by mouth 2 (two) times daily., Disp: 60 tablet, Rfl: 4   digoxin (LANOXIN) 0.25 MG tablet, Take 1 tablet (250 mcg total) by mouth daily., Disp: 90 tablet, Rfl: 3   doxazosin (CARDURA) 4 MG tablet, TAKE ONE TABLET BY MOUTH DAILY, Disp: 30 tablet, Rfl: 4   furosemide (LASIX) 20 MG tablet, TAKE ONE TABLET BY MOUTH DAILY, Disp: 90 tablet, Rfl: 1   loratadine (CLARITIN) 10 MG tablet, Take 10 mg by mouth daily., Disp: , Rfl:    mupirocin ointment (BACTROBAN) 2 %, Apply 1 application topically 2 (two) times daily., Disp: 22 g, Rfl: 4   olmesartan (BENICAR) 40  MG tablet, Take 1 tablet (40 mg total) by mouth daily., Disp: 90 tablet, Rfl: 3   pioglitazone (ACTOS) 45 MG tablet, Take 1 tablet (45 mg total) by mouth daily., Disp: 90 tablet, Rfl: 3   warfarin (COUMADIN) 5 MG tablet, TAKE ONE TABLET BY MOUTH DAILY, Disp: 90 tablet, Rfl: 2   glipiZIDE (GLUCOTROL XL) 10 MG 24 hr tablet, TAKE ONE TABLET BY MOUTH DAILY, Disp: 90 tablet, Rfl: 3  Current Facility-Administered Medications:    aspirin chewable tablet 324 mg, 324 mg, Oral, Once, Beckie Salts, FNP   No Known Allergies  ROS Review of Systems  Constitutional: Negative.   HENT: Negative.    Eyes: Negative.   Respiratory: Negative.    Cardiovascular: Negative.   Gastrointestinal: Negative.   Endocrine: Negative.   Genitourinary: Negative.   Musculoskeletal: Negative.   Skin: Negative.   Allergic/Immunologic: Negative.   Neurological: Negative.   Hematological: Negative.   Psychiatric/Behavioral: Negative.    All other systems reviewed and are negative.    Objective:    Physical Exam Vitals reviewed.  Constitutional:      Appearance: Normal appearance.  HENT:     Mouth/Throat:     Mouth: Mucous membranes are moist.  Eyes:     Pupils: Pupils are equal, round, and reactive to light.  Cardiovascular:     Rate and Rhythm: Normal rate. Rhythm irregular.     Pulses: Normal pulses.     Heart sounds: Normal heart sounds.  Pulmonary:     Effort: Pulmonary effort is normal.     Breath sounds: Normal breath sounds.  Abdominal:     General: Bowel sounds are normal.     Palpations: There is no hepatomegaly, splenomegaly or mass.     Tenderness: There is no abdominal tenderness.     Hernia: No hernia is present.  Musculoskeletal:     Right lower leg: No edema.     Left lower leg: No edema.  Skin:    Findings: No rash.  Neurological:     Mental Status: He is alert and oriented to person, place, and time.     Motor: No weakness.  Psychiatric:        Mood and Affect: Mood normal.         Behavior: Behavior normal.    BP (!) 150/88   Pulse 78   Ht 6' 1" (1.854 m)   Wt 236 lb 6.4 oz (107.2 kg)   BMI 31.19 kg/m  Wt Readings from Last 3 Encounters:  02/22/21 236 lb 6.4 oz (107.2 kg)  01/23/21 233 lb 1.6 oz (105.7 kg)  11/22/20 244 lb 14.4 oz (111.1 kg)     Health Maintenance Due  Topic Date Due   COVID-19 Vaccine (1) Never done   FOOT EXAM  Never done   OPHTHALMOLOGY EXAM  Never done   HIV Screening  Never done   Hepatitis C Screening  Never done   TETANUS/TDAP  Never done   COLONOSCOPY (Pts 45-60yr Insurance coverage will need to be confirmed)  Never done   Zoster Vaccines- Shingrix (1 of 2) Never done   INFLUENZA VACCINE  Never done    There are no preventive care reminders to display for this patient.  Lab Results  Component Value Date   TSH 2.97 07/20/2020   Lab Results  Component Value Date   WBC 6.3 08/12/2020   HGB 14.4 08/12/2020   HCT 42.3 08/12/2020   MCV 86.2 08/12/2020   PLT 158 08/12/2020   Lab Results  Component Value Date   NA 135 01/24/2021   K 4.0 01/24/2021   CO2 27 01/24/2021   GLUCOSE 395 (H) 01/24/2021   BUN 20 01/24/2021   CREATININE 1.38 (H) 01/24/2021   BILITOT 1.3 (H) 01/24/2021   ALKPHOS 65 08/12/2020   AST 17 01/24/2021   ALT 13 01/24/2021   PROT 6.8 01/24/2021   ALBUMIN 4.2 08/12/2020   CALCIUM 9.1 01/24/2021   ANIONGAP 12 08/12/2020   EGFR 57 (L) 01/24/2021   Lab Results  Component Value Date   CHOL 220 (H) 07/20/2020   Lab Results  Component Value Date   HDL 35 (L) 07/20/2020   Lab Results  Component Value Date   LDLCALC 161 (H) 07/20/2020   Lab Results  Component Value Date   TRIG 122 07/20/2020   Lab Results  Component Value Date   CHOLHDL 6.3 (H) 07/20/2020   Lab Results  Component Value Date   HGBA1C 10.0 11/22/2020      Assessment & Plan:   Problem List Items Addressed This Visit       Cardiovascular and Mediastinum   Hypertensive heart disease    The following  hypertensive lifestyle modification were recommended and discussed:  1. Limiting alcohol intake to less than 1 oz/day of ethanol:(24 oz of  beer or 8 oz of wine or 2 oz of 100-proof whiskey). . 3. Importance of regular aerobic exercise and losing weight. 4. Reduce dietary saturated fat and cholesterol intake for overall cardiovascular health. 5. Maintaining adequate dietary potassium, calcium, and magnesium intake. 6. Regular monitoring of the blood pressure. 7. Reduce sodium intake to less than 100 mmol/day (less than 2.3 gm of sodium or less than 6 gm of sodium choride)       Atrial fibrillation (HCC) - Primary    No syncope under control      Relevant Orders   POCT INR (Completed)     Endocrine   Diabetes 1.5, managed as type 2 ([place])    - The patient's blood sugar is labile on med. - The patient will continue the current treatment regimen.  - I encouraged the patient to regularly check blood sugar.  - I encouraged the patient to monitor diet. I encouraged the patient to eat low-carb and low-sugar to help prevent blood sugar spikes.  - I encouraged the patient to continue following their prescribed treatment plan for diabetes - I informed the patient to get help if blood sugar drops below 45m/dL, or if suddenly have trouble thinking clearly or breathing.  Patient was advised to buy a book on diabetes from a local bookstore or from [place]  Patient should read 2 chapters every day to keep the motivation going, this is in addition to some of the materials we provided them from the office.  There are other resources on the Internet like YouTube and wilkipedia to get an education on the diabetes        Other   Obesity (BMI 30-39.9)    Patient was advised to lose weight       No orders of the defined types were placed in this encounter.   Follow-up: No follow-ups on file.    JCletis Athens MD

## 2021-03-07 ENCOUNTER — Other Ambulatory Visit: Payer: Self-pay | Admitting: Internal Medicine

## 2021-03-12 ENCOUNTER — Encounter: Payer: Self-pay | Admitting: Internal Medicine

## 2021-03-12 NOTE — Assessment & Plan Note (Signed)

## 2021-03-12 NOTE — Assessment & Plan Note (Signed)
Patient was advised to lose weight 

## 2021-03-12 NOTE — Assessment & Plan Note (Addendum)
The following hypertensive lifestyle modification were recommended and discussed:  1. Limiting alcohol intake to less than 1 oz/day of ethanol:(24 oz of beer or 8 oz of wine or 2 oz of 100-proof whiskey).  3. Importance of regular aerobic exercise and losing weight. 4. Reduce dietary saturated fat and cholesterol intake for overall cardiovascular health. 5. Maintaining adequate dietary potassium, calcium, and magnesium intake. 6. Regular monitoring of the blood pressure. 7. Reduce sodium intake to less than 100 mmol/day (less than 2.3 gm of sodium or less than 6 gm of sodium choride)  

## 2021-03-12 NOTE — Assessment & Plan Note (Signed)
No syncope under control

## 2021-03-20 ENCOUNTER — Other Ambulatory Visit: Payer: Self-pay | Admitting: *Deleted

## 2021-03-20 MED ORDER — CLONIDINE HCL 0.2 MG PO TABS: 0.2000 mg | ORAL_TABLET | Freq: Two times a day (BID) | ORAL | 4 refills | Status: DC

## 2021-06-02 ENCOUNTER — Other Ambulatory Visit: Payer: Self-pay | Admitting: Internal Medicine

## 2021-07-26 ENCOUNTER — Other Ambulatory Visit: Payer: Self-pay

## 2021-07-26 ENCOUNTER — Telehealth: Payer: Self-pay

## 2021-07-26 DIAGNOSIS — Z1211 Encounter for screening for malignant neoplasm of colon: Secondary | ICD-10-CM

## 2021-07-26 NOTE — Telephone Encounter (Signed)
Patient will talk to his doctor about referral if he decides to schedule with [place] he will call us back

## 2021-07-27 ENCOUNTER — Telehealth: Payer: Self-pay

## 2021-07-27 NOTE — Telephone Encounter (Addendum)
Reminder letter sent out

## 2021-08-01 ENCOUNTER — Encounter: Payer: Self-pay | Admitting: Internal Medicine

## 2021-08-01 ENCOUNTER — Ambulatory Visit (INDEPENDENT_AMBULATORY_CARE_PROVIDER_SITE_OTHER): Payer: HMO | Admitting: Internal Medicine

## 2021-08-01 ENCOUNTER — Other Ambulatory Visit: Payer: Self-pay

## 2021-08-01 VITALS — BP 143/73 | HR 83 | Ht 73.0 in | Wt 230.2 lb

## 2021-08-01 DIAGNOSIS — I119 Hypertensive heart disease without heart failure: Secondary | ICD-10-CM

## 2021-08-01 DIAGNOSIS — I4891 Unspecified atrial fibrillation: Secondary | ICD-10-CM | POA: Diagnosis not present

## 2021-08-01 DIAGNOSIS — E669 Obesity, unspecified: Secondary | ICD-10-CM | POA: Diagnosis not present

## 2021-08-01 DIAGNOSIS — E139 Other specified diabetes mellitus without complications: Secondary | ICD-10-CM | POA: Diagnosis not present

## 2021-08-01 LAB — POCT GLYCOSYLATED HEMOGLOBIN (HGB A1C): HbA1c POC (<> result, manual entry): 12.9 % (ref 4.0–5.6)

## 2021-08-01 LAB — GLUCOSE, POCT (MANUAL RESULT ENTRY): POC Glucose: 301 mg/dl — AB (ref 70–99)

## 2021-08-01 LAB — POCT INR: INR: 1.8 — AB (ref 2.0–3.0)

## 2021-08-01 MED ORDER — DAPAGLIFLOZIN PROPANEDIOL 10 MG PO TABS: 10.0000 mg | ORAL_TABLET | Freq: Every day | ORAL | 1 refills | Status: DC

## 2021-08-01 NOTE — Progress Notes (Signed)
Established Patient Office Visit  Subjective:  Patient ID: Jesse Humphrey, male    DOB: June 29, 1956  Age: 66 y.o. MRN: [id number]  CC:  Chief Complaint  Patient presents with   Atrial Fibrillation   Diabetes    Atrial Fibrillation Past medical history includes atrial fibrillation.  Diabetes   Jesse Humphrey presents for diabetes  Past Medical History:  Diagnosis Date   Atrial fibrillation ([place])    Diabetes mellitus without complication ([place])    Hypertension     History reviewed. No pertinent surgical history.  History reviewed. No pertinent family history.  Social History   Socioeconomic History   Marital status: Single    Spouse name: Not on file   Number of children: Not on file   Years of education: Not on file   Highest education level: Not on file  Occupational History   Not on file  Tobacco Use   Smoking status: Never   Smokeless tobacco: Never  Substance and Sexual Activity   Alcohol use: Never   Drug use: Never   Sexual activity: Not on file  Other Topics Concern   Not on file  Social History Narrative   Not on file   Social Determinants of Health   Financial Resource Strain: Not on file  Food Insecurity: Not on file  Transportation Needs: Not on file  Physical Activity: Not on file  Stress: Not on file  Social Connections: Not on file  Intimate Partner Violence: Not on file     Current Outpatient Medications:    cloNIDine (CATAPRES) 0.2 MG tablet, Take 1 tablet (0.2 mg total) by mouth 2 (two) times daily., Disp: 60 tablet, Rfl: 4   digoxin (LANOXIN) 0.25 MG tablet, Take 1 tablet (250 mcg total) by mouth daily., Disp: 90 tablet, Rfl: 3   doxazosin (CARDURA) 4 MG tablet, TAKE ONE TABLET BY MOUTH DAILY, Disp: 30 tablet, Rfl: 4   furosemide (LASIX) 20 MG tablet, TAKE ONE TABLET BY MOUTH DAILY, Disp: 90 tablet, Rfl: 1   glipiZIDE (GLUCOTROL XL) 10 MG 24 hr tablet, TAKE ONE TABLET BY MOUTH DAILY, Disp: 90 tablet, Rfl: 3   loratadine (CLARITIN)  10 MG tablet, Take 10 mg by mouth daily., Disp: , Rfl:    mupirocin ointment (BACTROBAN) 2 %, Apply 1 application topically 2 (two) times daily., Disp: 22 g, Rfl: 4   olmesartan (BENICAR) 40 MG tablet, TAKE ONE TABLET BY MOUTH DAILY, Disp: 90 tablet, Rfl: 3   pioglitazone (ACTOS) 45 MG tablet, TAKE ONE TABLET BY MOUTH DAILY, Disp: 90 tablet, Rfl: 3   warfarin (COUMADIN) 5 MG tablet, TAKE ONE TABLET BY MOUTH DAILY, Disp: 90 tablet, Rfl: 2  Current Facility-Administered Medications:    aspirin chewable tablet 324 mg, 324 mg, Oral, Once, Beckie Salts, FNP   No Known Allergies  ROS Review of Systems  Constitutional: Negative.   HENT: Negative.    Eyes: Negative.   Respiratory: Negative.    Cardiovascular: Negative.   Gastrointestinal: Negative.   Endocrine: Negative.   Genitourinary: Negative.   Musculoskeletal: Negative.   Skin: Negative.   Allergic/Immunologic: Negative.   Neurological: Negative.   Hematological: Negative.   Psychiatric/Behavioral: Negative.    All other systems reviewed and are negative.    Objective:    Physical Exam Vitals reviewed.  Constitutional:      Appearance: Normal appearance.  HENT:     Mouth/Throat:     Mouth: Mucous membranes are moist.  Eyes:     Pupils: Pupils are  equal, round, and reactive to light.  Neck:     Vascular: No carotid bruit.  Cardiovascular:     Rate and Rhythm: Normal rate and regular rhythm.     Pulses: Normal pulses.     Heart sounds: Normal heart sounds.  Pulmonary:     Effort: Pulmonary effort is normal.     Breath sounds: Normal breath sounds.  Abdominal:     General: Bowel sounds are normal.     Palpations: Abdomen is soft. There is no hepatomegaly, splenomegaly or mass.     Tenderness: There is no abdominal tenderness.     Hernia: No hernia is present.  Musculoskeletal:     Cervical back: Neck supple.     Right lower leg: No edema.     Left lower leg: No edema.  Skin:    Findings: No rash.   Neurological:     Mental Status: He is alert and oriented to person, place, and time.     Motor: No weakness.  Psychiatric:        Mood and Affect: Mood normal.        Behavior: Behavior normal.    BP (!) 143/73    Pulse 83    Ht '6\' 1"'  (1.854 m)    Wt 230 lb 3.2 oz (104.4 kg)    BMI 30.37 kg/m  Wt Readings from Last 3 Encounters:  08/01/21 230 lb 3.2 oz (104.4 kg)  02/22/21 236 lb 6.4 oz (107.2 kg)  01/23/21 233 lb 1.6 oz (105.7 kg)     Health Maintenance Due  Topic Date Due   COVID-19 Vaccine (1) Never done   Pneumonia Vaccine 51+ Years old (1 - PCV) Never done   FOOT EXAM  Never done   OPHTHALMOLOGY EXAM  Never done   HIV Screening  Never done   Hepatitis C Screening  Never done   TETANUS/TDAP  Never done   COLONOSCOPY (Pts 45-50yr Insurance coverage will need to be confirmed)  Never done   Zoster Vaccines- Shingrix (1 of 2) Never done   INFLUENZA VACCINE  Never done    There are no preventive care reminders to display for this patient.  Lab Results  Component Value Date   TSH 2.97 07/20/2020   Lab Results  Component Value Date   WBC 6.3 08/12/2020   HGB 14.4 08/12/2020   HCT 42.3 08/12/2020   MCV 86.2 08/12/2020   PLT 158 08/12/2020   Lab Results  Component Value Date   NA 135 01/24/2021   K 4.0 01/24/2021   CO2 27 01/24/2021   GLUCOSE 395 (H) 01/24/2021   BUN 20 01/24/2021   CREATININE 1.38 (H) 01/24/2021   BILITOT 1.3 (H) 01/24/2021   ALKPHOS 65 08/12/2020   AST 17 01/24/2021   ALT 13 01/24/2021   PROT 6.8 01/24/2021   ALBUMIN 4.2 08/12/2020   CALCIUM 9.1 01/24/2021   ANIONGAP 12 08/12/2020   EGFR 57 (L) 01/24/2021   Lab Results  Component Value Date   CHOL 220 (H) 07/20/2020   Lab Results  Component Value Date   HDL 35 (L) 07/20/2020   Lab Results  Component Value Date   LDLCALC 161 (H) 07/20/2020   Lab Results  Component Value Date   TRIG 122 07/20/2020   Lab Results  Component Value Date   CHOLHDL 6.3 (H) 07/20/2020   Lab  Results  Component Value Date   HGBA1C 12.9 08/01/2021      Assessment & Plan:   Problem  List Items Addressed This Visit       Cardiovascular and Mediastinum   Atrial fibrillation ([place]) - Primary   Relevant Orders   POCT INR (Completed)     Endocrine   Diabetes 1.5, managed as type 2 ([place])   Relevant Orders   POCT glucose (manual entry) (Completed)   POCT HgB A1C (Completed)   I will start the patient on Farxiga 10 mg p.o. daily, patient was advised to, stop the Lasix,      Follow-up: No follow-ups on file.    Cletis [place], MD

## 2021-08-01 NOTE — Assessment & Plan Note (Signed)

## 2021-08-01 NOTE — Assessment & Plan Note (Signed)

## 2021-08-01 NOTE — Assessment & Plan Note (Signed)
Pro time is stable

## 2021-08-01 NOTE — Assessment & Plan Note (Signed)

## 2021-08-15 ENCOUNTER — Other Ambulatory Visit: Payer: Self-pay

## 2021-08-15 ENCOUNTER — Encounter: Payer: Self-pay | Admitting: Internal Medicine

## 2021-08-15 ENCOUNTER — Ambulatory Visit (INDEPENDENT_AMBULATORY_CARE_PROVIDER_SITE_OTHER): Payer: HMO | Admitting: Internal Medicine

## 2021-08-15 VITALS — BP 111/68 | HR 76 | Ht 73.0 in | Wt 226.3 lb

## 2021-08-15 DIAGNOSIS — I119 Hypertensive heart disease without heart failure: Secondary | ICD-10-CM

## 2021-08-15 DIAGNOSIS — E139 Other specified diabetes mellitus without complications: Secondary | ICD-10-CM

## 2021-08-15 DIAGNOSIS — I4891 Unspecified atrial fibrillation: Secondary | ICD-10-CM | POA: Diagnosis not present

## 2021-08-15 DIAGNOSIS — E119 Type 2 diabetes mellitus without complications: Secondary | ICD-10-CM | POA: Diagnosis not present

## 2021-08-15 DIAGNOSIS — E669 Obesity, unspecified: Secondary | ICD-10-CM | POA: Diagnosis not present

## 2021-08-15 LAB — GLUCOSE, POCT (MANUAL RESULT ENTRY): POC Glucose: 144 mg/dl — AB (ref 70–99)

## 2021-08-15 NOTE — Assessment & Plan Note (Signed)
Resolved

## 2021-08-15 NOTE — Progress Notes (Signed)
° °Established Patient Office Visit ° °Subjective:  °Patient ID: Jesse Humphrey, male    DOB: 06/21/1956  Age: 65 y.o. MRN: [id number] ° °CC:  °Chief Complaint  °Patient presents with  ° Diabetes  °  Patient here for recheck of bs after starting farxiga   ° ° °Diabetes ° ° °Jesse Humphrey presents for diabetes check ° °Past Medical History:  °Diagnosis Date  ° Atrial fibrillation (HCC)   ° Diabetes mellitus without complication (HCC)   ° Hypertension   ° ° °History reviewed. No pertinent surgical history. ° °History reviewed. No pertinent family history. ° °Social History  ° °Socioeconomic History  ° Marital status: Single  °  Spouse name: Not on file  ° Number of children: Not on file  ° Years of education: Not on file  ° Highest education level: Not on file  °Occupational History  ° Not on file  °Tobacco Use  ° Smoking status: Never  ° Smokeless tobacco: Never  °Substance and Sexual Activity  ° Alcohol use: Never  ° Drug use: Never  ° Sexual activity: Not on file  °Other Topics Concern  ° Not on file  °Social History Narrative  ° Not on file  ° °Social Determinants of Health  ° °Financial Resource Strain: Not on file  °Food Insecurity: Not on file  °Transportation Needs: Not on file  °Physical Activity: Not on file  °Stress: Not on file  °Social Connections: Not on file  °Intimate Partner Violence: Not on file  ° ° ° °Current Outpatient Medications:  °  cloNIDine (CATAPRES) 0.2 MG tablet, Take 1 tablet (0.2 mg total) by mouth 2 (two) times daily., Disp: 60 tablet, Rfl: 4 °  dapagliflozin propanediol (FARXIGA) 10 MG TABS tablet, Take 1 tablet (10 mg total) by mouth daily before breakfast., Disp: 30 tablet, Rfl: 1 °  digoxin (LANOXIN) 0.25 MG tablet, Take 1 tablet (250 mcg total) by mouth daily., Disp: 90 tablet, Rfl: 3 °  doxazosin (CARDURA) 4 MG tablet, TAKE ONE TABLET BY MOUTH DAILY, Disp: 30 tablet, Rfl: 4 °  furosemide (LASIX) 20 MG tablet, TAKE ONE TABLET BY MOUTH DAILY, Disp: 90 tablet, Rfl: 1 °  glipiZIDE  (GLUCOTROL XL) 10 MG 24 hr tablet, TAKE ONE TABLET BY MOUTH DAILY, Disp: 90 tablet, Rfl: 3 °  loratadine (CLARITIN) 10 MG tablet, Take 10 mg by mouth daily., Disp: , Rfl:  °  mupirocin ointment (BACTROBAN) 2 %, Apply 1 application topically 2 (two) times daily., Disp: 22 g, Rfl: 4 °  olmesartan (BENICAR) 40 MG tablet, TAKE ONE TABLET BY MOUTH DAILY, Disp: 90 tablet, Rfl: 3 °  pioglitazone (ACTOS) 45 MG tablet, TAKE ONE TABLET BY MOUTH DAILY, Disp: 90 tablet, Rfl: 3 °  warfarin (COUMADIN) 5 MG tablet, TAKE ONE TABLET BY MOUTH DAILY, Disp: 90 tablet, Rfl: 2 ° °Current Facility-Administered Medications:  °  aspirin chewable tablet 324 mg, 324 mg, Oral, Once, Kanady, Jarrod, FNP  ° °No Known Allergies ° °ROS °Review of Systems  °Constitutional: Negative.   °HENT: Negative.    °Eyes: Negative.   °Respiratory: Negative.    °Cardiovascular: Negative.   °Gastrointestinal: Negative.   °Endocrine: Negative.   °Genitourinary: Negative.   °Musculoskeletal: Negative.   °Skin: Negative.   °Allergic/Immunologic: Negative.   °Neurological: Negative.   °Hematological: Negative.   °Psychiatric/Behavioral: Negative.    °All other systems reviewed and are negative. ° °  °Objective:  °  °Physical Exam °Vitals reviewed.  °Constitutional:   °   Appearance: Normal   Normal appearance.  HENT:     Mouth/Throat:     Mouth: Mucous membranes are moist.  Eyes:     Pupils: Pupils are equal, round, and reactive to light.  Neck:     Vascular: No carotid bruit.  Cardiovascular:     Rate and Rhythm: Normal rate and regular rhythm.     Pulses: Normal pulses.     Heart sounds: Normal heart sounds.  Pulmonary:     Effort: Pulmonary effort is normal.     Breath sounds: Normal breath sounds.  Abdominal:     General: Bowel sounds are normal.     Palpations: Abdomen is soft. There is no hepatomegaly, splenomegaly or mass.     Tenderness: There is no abdominal tenderness.     Hernia: No hernia is present.  Musculoskeletal:     Cervical back: Neck  supple.     Right lower leg: No edema.     Left lower leg: No edema.  Skin:    Findings: No rash.  Neurological:     Mental Status: He is alert and oriented to person, place, and time.     Motor: No weakness.  Psychiatric:        Mood and Affect: Mood normal.        Behavior: Behavior normal.    BP 111/68    Pulse 76    Ht 6' 1" (1.854 m)    Wt 226 lb 4.8 oz (102.6 kg)    BMI 29.86 kg/m  Wt Readings from Last 3 Encounters:  08/15/21 226 lb 4.8 oz (102.6 kg)  08/01/21 230 lb 3.2 oz (104.4 kg)  02/22/21 236 lb 6.4 oz (107.2 kg)     Health Maintenance Due  Topic Date Due   COVID-19 Vaccine (1) Never done   FOOT EXAM  Never done   OPHTHALMOLOGY EXAM  Never done   HIV Screening  Never done   Hepatitis C Screening  Never done   TETANUS/TDAP  Never done   COLONOSCOPY (Pts 45-52yr Insurance coverage will need to be confirmed)  Never done   Zoster Vaccines- Shingrix (1 of 2) Never done   INFLUENZA VACCINE  Never done   Pneumonia Vaccine 66 Years old (1 - PCV) Never done    There are no preventive care reminders to display for this patient.  Lab Results  Component Value Date   TSH 2.97 07/20/2020   Lab Results  Component Value Date   WBC 6.3 08/12/2020   HGB 14.4 08/12/2020   HCT 42.3 08/12/2020   MCV 86.2 08/12/2020   PLT 158 08/12/2020   Lab Results  Component Value Date   NA 135 01/24/2021   K 4.0 01/24/2021   CO2 27 01/24/2021   GLUCOSE 395 (H) 01/24/2021   BUN 20 01/24/2021   CREATININE 1.38 (H) 01/24/2021   BILITOT 1.3 (H) 01/24/2021   ALKPHOS 65 08/12/2020   AST 17 01/24/2021   ALT 13 01/24/2021   PROT 6.8 01/24/2021   ALBUMIN 4.2 08/12/2020   CALCIUM 9.1 01/24/2021   ANIONGAP 12 08/12/2020   EGFR 57 (L) 01/24/2021   Lab Results  Component Value Date   CHOL 220 (H) 07/20/2020   Lab Results  Component Value Date   HDL 35 (L) 07/20/2020   Lab Results  Component Value Date   LDLCALC 161 (H) 07/20/2020   Lab Results  Component Value Date    TRIG 122 07/20/2020   Lab Results  Component Value Date   CHOLHDL 6.3 (  07/20/2020  ° °Lab Results  °Component Value Date  ° HGBA1C 12.9 08/01/2021  ° ° °  °Assessment & Plan:  ° °Problem List Items Addressed This Visit   ° °  ° Cardiovascular and Mediastinum  ° Hypertensive heart disease  °   Patient denies any chest pain or shortness of breath there is no history of palpitation or paroxysmal nocturnal dyspnea °  patient was advised to follow low-salt low-cholesterol diet ° °  ideally I want to keep systolic blood pressure below 130 mmHg, patient was asked to check blood pressure one times a week and give me a report on that.  Patient will be follow-up in 3 months  or earlier as needed, patient will call me back for any change in the cardiovascular symptoms °Patient was advised to buy a book from local bookstore concerning blood pressure and read several chapters  every day.  This will be supplemented by some of the material we will give him from the office.  Patient should also utilize other resources like YouTube and Internet to learn more about the blood pressure and the diet. °  °  ° Atrial fibrillation (HCC)  °  Resolved °  °  °  ° Endocrine  ° Diabetes 1.5, managed as type 2 (HCC)  °  - The patient's blood sugar is labile on med. °- The patient will continue the current treatment regimen.  °- I encouraged the patient to regularly check blood sugar.  °- I encouraged the patient to monitor diet. I encouraged the patient to eat low-carb and low-sugar to help prevent blood sugar spikes.  °- I encouraged the patient to continue following their prescribed treatment plan for diabetes °- I informed the patient to get help if blood sugar drops below 54mg/dL, or if suddenly have trouble thinking clearly or breathing.  °Patient was advised to buy a book on diabetes from a local bookstore or from [employer].  Patient should read 2 chapters every day to keep the motivation going, this is in addition to some of the  materials we provided them from the office.  There are other resources on the Internet like YouTube and wilkipedia to get an education on the diabetes °  °  °  ° Other  ° Obesity (BMI 30-39.9)  °  - I encouraged the patient to lose weight.  °- I educated them on making healthy dietary choices including eating more fruits and vegetables and less fried foods. °- I encouraged the patient to exercise more, and educated on the benefits of exercise including weight loss, diabetes prevention, and hypertension prevention.  ° Dietary counseling with a registered dietician ° Referral to a weight management support group (e.g. Weight Watchers, Overeaters Anonymous) °° If your BMI is greater than 29 or you have gained more than 15 pounds you should work on weight loss. °° Attend a healthy cooking class  °  °  ° °Other Visit Diagnoses   ° ° Type 2 diabetes mellitus without complication, without long-term current use of insulin (HCC)    -  Primary  ° Relevant Orders  ° POCT glucose (manual entry) (Completed)  ° °  ° ° °No orders of the defined types were placed in this encounter. ° ° °Follow-up: No follow-ups on file.  ° ° °Javed Masoud, MD °

## 2021-08-15 NOTE — Assessment & Plan Note (Signed)

## 2021-08-15 NOTE — Assessment & Plan Note (Signed)

## 2021-08-15 NOTE — Assessment & Plan Note (Signed)

## 2021-08-21 DIAGNOSIS — E782 Mixed hyperlipidemia: Secondary | ICD-10-CM | POA: Diagnosis not present

## 2021-08-21 DIAGNOSIS — I10 Essential (primary) hypertension: Secondary | ICD-10-CM | POA: Diagnosis not present

## 2021-08-21 DIAGNOSIS — R0602 Shortness of breath: Secondary | ICD-10-CM | POA: Diagnosis not present

## 2021-08-21 DIAGNOSIS — I4891 Unspecified atrial fibrillation: Secondary | ICD-10-CM | POA: Diagnosis not present

## 2021-08-24 ENCOUNTER — Other Ambulatory Visit: Payer: Self-pay

## 2021-08-24 MED ORDER — ACYCLOVIR 800 MG PO TABS: 800.0000 mg | ORAL_TABLET | Freq: Every day | ORAL | 0 refills | Status: AC

## 2021-08-29 ENCOUNTER — Encounter: Payer: Self-pay | Admitting: Internal Medicine

## 2021-08-29 ENCOUNTER — Other Ambulatory Visit: Payer: Self-pay

## 2021-08-29 ENCOUNTER — Ambulatory Visit (INDEPENDENT_AMBULATORY_CARE_PROVIDER_SITE_OTHER): Payer: HMO | Admitting: Internal Medicine

## 2021-08-29 VITALS — BP 124/69 | HR 69 | Ht 73.0 in | Wt 227.4 lb

## 2021-08-29 DIAGNOSIS — E139 Other specified diabetes mellitus without complications: Secondary | ICD-10-CM

## 2021-08-29 DIAGNOSIS — I4891 Unspecified atrial fibrillation: Secondary | ICD-10-CM

## 2021-08-29 DIAGNOSIS — E119 Type 2 diabetes mellitus without complications: Secondary | ICD-10-CM

## 2021-08-29 DIAGNOSIS — I119 Hypertensive heart disease without heart failure: Secondary | ICD-10-CM

## 2021-08-29 DIAGNOSIS — E669 Obesity, unspecified: Secondary | ICD-10-CM

## 2021-08-29 DIAGNOSIS — B0089 Other herpesviral infection: Secondary | ICD-10-CM

## 2021-08-29 LAB — GLUCOSE, POCT (MANUAL RESULT ENTRY): POC Glucose: 130 mg/dl — AB (ref 70–99)

## 2021-08-29 NOTE — Assessment & Plan Note (Signed)

## 2021-08-29 NOTE — Assessment & Plan Note (Signed)
Stable at the present time on medication 

## 2021-08-29 NOTE — Assessment & Plan Note (Signed)

## 2021-08-29 NOTE — Progress Notes (Signed)
Established Patient Office Visit  Subjective:  Patient ID: Jesse Humphrey, male    DOB: 1956-03-12  Age: 66 y.o. MRN: [id number]  CC:  Chief Complaint  Patient presents with   Herpes Zoster    Patient here for follow up of shingles. Patient states that the shingles has cleared since starting the medication.     HPI  Jesse Humphrey presented with sihngle rash, is getting better patient denies any history of fever chills or pleurisy like symptoms Past Medical History:  Diagnosis Date   Atrial fibrillation ([place])    Diabetes mellitus without complication ( [place])    Hypertension     History reviewed. No pertinent surgical history.  History reviewed. No pertinent family history.  Social History   Socioeconomic History   Marital status: Single    Spouse name: Not on file   Number of children: Not on file   Years of education: Not on file   Highest education level: Not on file  Occupational History   Not on file  Tobacco Use   Smoking status: Never   Smokeless tobacco: Never  Substance and Sexual Activity   Alcohol use: Never   Drug use: Never   Sexual activity: Not on file  Other Topics Concern   Not on file  Social History Narrative   Not on file   Social Determinants of Health   Financial Resource Strain: Not on file  Food Insecurity: Not on file  Transportation Needs: Not on file  Physical Activity: Not on file  Stress: Not on file  Social Connections: Not on file  Intimate Partner Violence: Not on file     Current Outpatient Medications:    acyclovir (ZOVIRAX) 800 MG tablet, Take 1 tablet (800 mg total) by mouth 5 (five) times daily for 10 days., Disp: 50 tablet, Rfl: 0   cloNIDine (CATAPRES) 0.2 MG tablet, Take 1 tablet (0.2 mg total) by mouth 2 (two) times daily., Disp: 60 tablet, Rfl: 4   dapagliflozin propanediol (FARXIGA) 10 MG TABS tablet, Take 1 tablet (10 mg total) by mouth daily before breakfast., Disp: 30 tablet, Rfl: 1   digoxin (LANOXIN) 0.25  MG tablet, Take 1 tablet (250 mcg total) by mouth daily., Disp: 90 tablet, Rfl: 3   doxazosin (CARDURA) 4 MG tablet, TAKE ONE TABLET BY MOUTH DAILY, Disp: 30 tablet, Rfl: 4   furosemide (LASIX) 20 MG tablet, TAKE ONE TABLET BY MOUTH DAILY, Disp: 90 tablet, Rfl: 1   glipiZIDE (GLUCOTROL XL) 10 MG 24 hr tablet, TAKE ONE TABLET BY MOUTH DAILY, Disp: 90 tablet, Rfl: 3   loratadine (CLARITIN) 10 MG tablet, Take 10 mg by mouth daily., Disp: , Rfl:    mupirocin ointment (BACTROBAN) 2 %, Apply 1 application topically 2 (two) times daily., Disp: 22 g, Rfl: 4   olmesartan (BENICAR) 40 MG tablet, TAKE ONE TABLET BY MOUTH DAILY, Disp: 90 tablet, Rfl: 3   pioglitazone (ACTOS) 45 MG tablet, TAKE ONE TABLET BY MOUTH DAILY, Disp: 90 tablet, Rfl: 3   warfarin (COUMADIN) 5 MG tablet, TAKE ONE TABLET BY MOUTH DAILY, Disp: 90 tablet, Rfl: 2  Current Facility-Administered Medications:    aspirin chewable tablet 324 mg, 324 mg, Oral, Once, Beckie Salts, FNP   No Known Allergies  ROS Review of Systems  Constitutional: Negative.   HENT: Negative.    Eyes: Negative.   Respiratory: Negative.    Cardiovascular: Negative.   Gastrointestinal: Negative.   Endocrine: Negative.   Genitourinary: Negative.   Musculoskeletal: Negative.  Skin: Negative.   Allergic/Immunologic: Negative.   Neurological: Negative.   Hematological: Negative.   Psychiatric/Behavioral: Negative.    All other systems reviewed and are negative.    Objective:    Physical Exam Vitals reviewed.  Constitutional:      Appearance: Normal appearance.  HENT:     Mouth/Throat:     Mouth: Mucous membranes are moist.  Eyes:     Pupils: Pupils are equal, round, and reactive to light.  Neck:     Vascular: No carotid bruit.  Cardiovascular:     Rate and Rhythm: Normal rate and regular rhythm.     Pulses: Normal pulses.     Heart sounds: Normal heart sounds.  Pulmonary:     Effort: Pulmonary effort is normal.     Breath sounds: Normal  breath sounds.  Abdominal:     General: Bowel sounds are normal.     Palpations: Abdomen is soft. There is no hepatomegaly, splenomegaly or mass.     Tenderness: There is no abdominal tenderness.     Hernia: No hernia is present.  Musculoskeletal:     Cervical back: Neck supple.     Right lower leg: No edema.     Left lower leg: No edema.  Skin:    Findings: No rash.     Comments: Patient has a rash on the right side of the chest going to the back consistent with shingles or zoster, patient denies any chest pain fever chills or pleurisy like symptoms there is no history of fever or chills.  Rash is getting better  Neurological:     Mental Status: He is alert and oriented to person, place, and time.     Motor: No weakness.  Psychiatric:        Mood and Affect: Mood normal.        Behavior: Behavior normal.    BP 124/69    Pulse 69    Ht '6\' 1"'  (1.854 m)    Wt 227 lb 6.4 oz (103.1 kg)    BMI 30.00 kg/m  Wt Readings from Last 3 Encounters:  08/29/21 227 lb 6.4 oz (103.1 kg)  08/15/21 226 lb 4.8 oz (102.6 kg)  08/01/21 230 lb 3.2 oz (104.4 kg)     Health Maintenance Due  Topic Date Due   COVID-19 Vaccine (1) Never done   FOOT EXAM  Never done   OPHTHALMOLOGY EXAM  Never done   HIV Screening  Never done   Hepatitis C Screening  Never done   TETANUS/TDAP  Never done   COLONOSCOPY (Pts 45-63yr Insurance coverage will need to be confirmed)  Never done   Zoster Vaccines- Shingrix (1 of 2) Never done   INFLUENZA VACCINE  Never done   Pneumonia Vaccine 66 Years old (1 - PCV) Never done    There are no preventive care reminders to display for this patient.  Lab Results  Component Value Date   TSH 2.97 07/20/2020   Lab Results  Component Value Date   WBC 6.3 08/12/2020   HGB 14.4 08/12/2020   HCT 42.3 08/12/2020   MCV 86.2 08/12/2020   PLT 158 08/12/2020   Lab Results  Component Value Date   NA 135 01/24/2021   K 4.0 01/24/2021   CO2 27 01/24/2021   GLUCOSE 395 (H)  01/24/2021   BUN 20 01/24/2021   CREATININE 1.38 (H) 01/24/2021   BILITOT 1.3 (H) 01/24/2021   ALKPHOS 65 08/12/2020   AST 17 01/24/2021  ALT 13 01/24/2021   PROT 6.8 01/24/2021   ALBUMIN 4.2 08/12/2020   CALCIUM 9.1 01/24/2021   ANIONGAP 12 08/12/2020   EGFR 57 (L) 01/24/2021   Lab Results  Component Value Date   CHOL 220 (H) 07/20/2020   Lab Results  Component Value Date   HDL 35 (L) 07/20/2020   Lab Results  Component Value Date   LDLCALC 161 (H) 07/20/2020   Lab Results  Component Value Date   TRIG 122 07/20/2020   Lab Results  Component Value Date   CHOLHDL 6.3 (H) 07/20/2020   Lab Results  Component Value Date   HGBA1C 12.9 08/01/2021      Assessment & Plan:   Problem List Items Addressed This Visit       Cardiovascular and Mediastinum   Hypertensive heart disease     Patient denies any chest pain or shortness of breath there is no history of palpitation or paroxysmal nocturnal dyspnea   patient was advised to follow low-salt low-cholesterol diet    ideally I want to keep systolic blood pressure below 130 mmHg, patient was asked to check blood pressure one times a week and give me a report on that.  Patient will be follow-up in 3 months  or earlier as needed, patient will call me back for any change in the cardiovascular symptoms Patient was advised to buy a book from local bookstore concerning blood pressure and read several chapters  every day.  This will be supplemented by some of the material we will give him from the office.  Patient should also utilize other resources like YouTube and Internet to learn more about the blood pressure and the diet.      Atrial fibrillation (HCC)    Stable at the present time on medication        Endocrine   Diabetes 1.5, managed as type 2 ([place])    - The patient's blood sugar is labile on med. - The patient will continue the current treatment regimen.  - I encouraged the patient to regularly check blood sugar.   - I encouraged the patient to monitor diet. I encouraged the patient to eat low-carb and low-sugar to help prevent blood sugar spikes.  - I encouraged the patient to continue following their prescribed treatment plan for diabetes - I informed the patient to get help if blood sugar drops below 74m/dL, or if suddenly have trouble thinking clearly or breathing.  Patient was advised to buy a book on diabetes from a local bookstore or from [place]  Patient should read 2 chapters every day to keep the motivation going, this is in addition to some of the materials we provided them from the office.  There are other resources on the Internet like YouTube and wilkipedia to get an education on the diabetes        Musculoskeletal and Integument   Herpes dermatitis    Patient has been treated for herpes with Zovirax, he is doing better the rash is much better I will see him back in 2 to 3 weeks        Other   Obesity (BMI 30-39.9)    - I encouraged the patient to lose weight.  - I educated them on making healthy dietary choices including eating more fruits and vegetables and less fried foods. - I encouraged the patient to exercise more, and educated on the benefits of exercise including weight loss, diabetes prevention, and hypertension prevention.   Dietary counseling with a  registered dietician  Referral to a weight management support group (e.g. Weight Watchers, Overeaters Anonymous)  If your BMI is greater than 29 or you have gained more than 15 pounds you should work on weight loss.  Attend a healthy cooking class       Other Visit Diagnoses     Type 2 diabetes mellitus without complication, without long-term current use of insulin ([place])    -  Primary   Relevant Orders   POCT glucose (manual entry) (Completed)       No orders of the defined types were placed in this encounter.   Follow-up: No follow-ups on file.    Cletis Athens, MD

## 2021-08-29 NOTE — Assessment & Plan Note (Signed)
Patient has been treated for herpes with Zovirax, he is doing better the rash is much better I will see him back in 2 to 3 weeks

## 2021-08-29 NOTE — Assessment & Plan Note (Signed)

## 2021-09-07 ENCOUNTER — Other Ambulatory Visit: Payer: Self-pay | Admitting: *Deleted

## 2021-09-07 MED ORDER — MUPIROCIN 2 % EX OINT: 1.0000 "application " | TOPICAL_OINTMENT | Freq: Two times a day (BID) | CUTANEOUS | 4 refills | Status: DC

## 2021-09-12 ENCOUNTER — Other Ambulatory Visit: Payer: Self-pay

## 2021-09-12 ENCOUNTER — Encounter: Payer: Self-pay | Admitting: Internal Medicine

## 2021-09-12 ENCOUNTER — Ambulatory Visit (INDEPENDENT_AMBULATORY_CARE_PROVIDER_SITE_OTHER): Payer: HMO | Admitting: Internal Medicine

## 2021-09-12 VITALS — BP 126/77 | HR 71 | Ht 73.0 in | Wt 219.9 lb

## 2021-09-12 DIAGNOSIS — I4891 Unspecified atrial fibrillation: Secondary | ICD-10-CM

## 2021-09-12 DIAGNOSIS — B0089 Other herpesviral infection: Secondary | ICD-10-CM

## 2021-09-12 DIAGNOSIS — E669 Obesity, unspecified: Secondary | ICD-10-CM | POA: Diagnosis not present

## 2021-09-12 DIAGNOSIS — I119 Hypertensive heart disease without heart failure: Secondary | ICD-10-CM

## 2021-09-12 DIAGNOSIS — E139 Other specified diabetes mellitus without complications: Secondary | ICD-10-CM | POA: Diagnosis not present

## 2021-09-12 LAB — POCT GLYCOSYLATED HEMOGLOBIN (HGB A1C): Hemoglobin A1C: 8 % — AB (ref 4.0–5.6)

## 2021-09-12 LAB — POCT INR: INR: 1.7 — AB (ref 2.0–3.0)

## 2021-09-12 LAB — GLUCOSE, POCT (MANUAL RESULT ENTRY): POC Glucose: 128 mg/dl — AB (ref 70–99)

## 2021-09-12 NOTE — Assessment & Plan Note (Signed)
Hemoglobin AIC is coming down ?

## 2021-09-12 NOTE — Assessment & Plan Note (Signed)

## 2021-09-12 NOTE — Assessment & Plan Note (Signed)
Resolved

## 2021-09-12 NOTE — Progress Notes (Signed)
? ?Established Patient Office Visit ? ?Subjective:  ?Patient ID: Jesse Humphrey, male    DOB: 1955-11-18  Age: 66 y.o. MRN: [id number] ? ?CC:  ?Chief Complaint  ?Patient presents with  ? Follow-up  ? ? ?HPI ? ?Jesse Humphrey presents for diabetes follow up ? ?Past Medical History:  ?Diagnosis Date  ? Atrial fibrillation ([place])   ? Diabetes mellitus without complication ([place])   ? Hypertension   ? ? ?History reviewed. No pertinent surgical history. ? ?History reviewed. No pertinent family history. ? ?Social History  ? ?Socioeconomic History  ? Marital status: Single  ?  Spouse name: Not on file  ? Number of children: Not on file  ? Years of education: Not on file  ? Highest education level: Not on file  ?Occupational History  ? Not on file  ?Tobacco Use  ? Smoking status: Never  ? Smokeless tobacco: Never  ?Substance and Sexual Activity  ? Alcohol use: Never  ? Drug use: Never  ? Sexual activity: Not on file  ?Other Topics Concern  ? Not on file  ?Social History Narrative  ? Not on file  ? ?Social Determinants of Health  ? ?Financial Resource Strain: Not on file  ?Food Insecurity: Not on file  ?Transportation Needs: Not on file  ?Physical Activity: Not on file  ?Stress: Not on file  ?Social Connections: Not on file  ?Intimate Partner Violence: Not on file  ? ? ? ?Current Outpatient Medications:  ?  cloNIDine (CATAPRES) 0.2 MG tablet, Take 1 tablet (0.2 mg total) by mouth 2 (two) times daily., Disp: 60 tablet, Rfl: 4 ?  dapagliflozin propanediol (FARXIGA) 10 MG TABS tablet, Take 1 tablet (10 mg total) by mouth daily before breakfast., Disp: 30 tablet, Rfl: 1 ?  digoxin (LANOXIN) 0.25 MG tablet, Take 1 tablet (250 mcg total) by mouth daily., Disp: 90 tablet, Rfl: 3 ?  doxazosin (CARDURA) 4 MG tablet, TAKE ONE TABLET BY MOUTH DAILY, Disp: 30 tablet, Rfl: 4 ?  furosemide (LASIX) 20 MG tablet, TAKE ONE TABLET BY MOUTH DAILY, Disp: 90 tablet, Rfl: 1 ?  glipiZIDE (GLUCOTROL XL) 10 MG 24 hr tablet, TAKE ONE TABLET BY MOUTH  DAILY, Disp: 90 tablet, Rfl: 3 ?  loratadine (CLARITIN) 10 MG tablet, Take 10 mg by mouth daily., Disp: , Rfl:  ?  mupirocin ointment (BACTROBAN) 2 %, Apply 1 application. topically 2 (two) times daily., Disp: 22 g, Rfl: 4 ?  olmesartan (BENICAR) 40 MG tablet, TAKE ONE TABLET BY MOUTH DAILY, Disp: 90 tablet, Rfl: 3 ?  pioglitazone (ACTOS) 45 MG tablet, TAKE ONE TABLET BY MOUTH DAILY, Disp: 90 tablet, Rfl: 3 ?  warfarin (COUMADIN) 5 MG tablet, TAKE ONE TABLET BY MOUTH DAILY, Disp: 90 tablet, Rfl: 2 ? ?Current Facility-Administered Medications:  ?  aspirin chewable tablet 324 mg, 324 mg, Oral, Once, Beckie Salts, FNP  ? ?No Known Allergies ? ?ROS ?Review of Systems  ?Constitutional: Negative.   ?HENT: Negative.    ?Eyes: Negative.   ?Respiratory: Negative.    ?Cardiovascular: Negative.   ?Gastrointestinal: Negative.   ?Endocrine: Negative.   ?Genitourinary: Negative.   ?Musculoskeletal: Negative.   ?Skin: Negative.   ?Allergic/Immunologic: Negative.   ?Neurological: Negative.   ?Hematological: Negative.   ?Psychiatric/Behavioral: Negative.    ?All other systems reviewed and are negative. ? ?  ?Objective:  ?  ?Physical Exam ?Vitals reviewed.  ?Constitutional:   ?   Appearance: Normal appearance.  ?HENT:  ?   Mouth/Throat:  ?   Mouth:  Mucous membranes are moist.  ?Eyes:  ?   Pupils: Pupils are equal, round, and reactive to light.  ?Neck:  ?   Vascular: No carotid bruit.  ?Cardiovascular:  ?   Rate and Rhythm: Normal rate and regular rhythm.  ?   Pulses: Normal pulses.  ?   Heart sounds: Normal heart sounds.  ?Pulmonary:  ?   Effort: Pulmonary effort is normal.  ?   Breath sounds: Normal breath sounds.  ?Abdominal:  ?   General: Bowel sounds are normal.  ?   Palpations: Abdomen is soft. There is no hepatomegaly, splenomegaly or mass.  ?   Tenderness: There is no abdominal tenderness.  ?   Hernia: No hernia is present.  ?Musculoskeletal:  ?   Cervical back: Neck supple.  ?   Right lower leg: No edema.  ?   Left lower  leg: No edema.  ?Skin: ?   Findings: No rash.  ?Neurological:  ?   Mental Status: He is alert and oriented to person, place, and time.  ?   Motor: No weakness.  ?Psychiatric:     ?   Mood and Affect: Mood normal.     ?   Behavior: Behavior normal.  ? ? ?BP 126/77   Pulse 71   Ht _0  (1.854 m)   Wt 219 lb 14.4 oz (99.7 kg)   BMI 29.01 kg/m?  ?Wt Readings from Last 3 Encounters:  ?09/12/21 219 lb 14.4 oz (99.7 kg)  ?08/29/21 227 lb 6.4 oz (103.1 kg)  ?08/15/21 226 lb 4.8 oz (102.6 kg)  ? ? ? ?Health Maintenance Due  ?Topic Date Due  ? FOOT EXAM  Never done  ? OPHTHALMOLOGY EXAM  Never done  ? HIV Screening  Never done  ? ? ?There are no preventive care reminders to display for this patient. ? ?Lab Results  ?Component Value Date  ? TSH 2.97 07/20/2020  ? ?Lab Results  ?Component Value Date  ? WBC 6.3 08/12/2020  ? HGB 14.4 08/12/2020  ? HCT 42.3 08/12/2020  ? MCV 86.2 08/12/2020  ? PLT 158 08/12/2020  ? ?Lab Results  ?Component Value Date  ? NA 135 01/24/2021  ? K 4.0 01/24/2021  ? CO2 27 01/24/2021  ? GLUCOSE 395 (H) 01/24/2021  ? BUN 20 01/24/2021  ? CREATININE 1.38 (H) 01/24/2021  ? BILITOT 1.3 (H) 01/24/2021  ? ALKPHOS 65 08/12/2020  ? AST 17 01/24/2021  ? ALT 13 01/24/2021  ? PROT 6.8 01/24/2021  ? ALBUMIN 4.2 08/12/2020  ? CALCIUM 9.1 01/24/2021  ? ANIONGAP 12 08/12/2020  ? EGFR 57 (L) 01/24/2021  ? ?Lab Results  ?Component Value Date  ? CHOL 220 (H) 07/20/2020  ? ?Lab Results  ?Component Value Date  ? HDL 35 (L) 07/20/2020  ? ?Lab Results  ?Component Value Date  ? LDLCALC 161 (H) 07/20/2020  ? ?Lab Results  ?Component Value Date  ? TRIG 122 07/20/2020  ? ?Lab Results  ?Component Value Date  ? CHOLHDL 6.3 (H) 07/20/2020  ? ?Lab Results  ?Component Value Date  ? HGBA1C 8.0 (A) 09/12/2021  ? ? ?  ?Assessment & Plan:  ? ?Problem List Items Addressed This Visit   ? ?  ? Cardiovascular and Mediastinum  ? Hypertensive heart disease  ?   Patient denies any chest pain or shortness of breath there is no history of  palpitation or paroxysmal nocturnal dyspnea ?  patient was advised to follow low-salt low-cholesterol diet ? ?  ideally  I want to keep systolic blood pressure below 130 mmHg, patient was asked to check blood pressure one times a week and give me a report on that.  Patient will be follow-up in 3 months  or earlier as needed, patient will call me back for any change in the cardiovascular symptoms ?Patient was advised to buy a book from local bookstore concerning blood pressure and read several chapters  every day.  This will be supplemented by some of the material we will give him from the office.  Patient should also utilize other resources like YouTube and Internet to learn more about the blood pressure and the diet. ?  ?  ? Atrial fibrillation ([place])  ?  Patient was advised to take 5 mg alternating with 2.5 mg of Coumadin daily ?  ?  ? Relevant Orders  ? POCT INR (Completed)  ?  ? Endocrine  ? 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  Hemoglobin AIC is coming down ?  ?  ? Relevant Orders  ? POCT glucose (manual entry) (Completed)  ? POCT glycosylated hemoglobin (Hb A1C) (Completed)  ?  ? Musculoskeletal and Integument  ? Herpes dermatitis  ?  Resolved ?  ?  ?  ? Other  ? Obesity (BMI 30-39.9)  ?  - I encouraged the patient to lose weight.  ?- I educated them on making healthy dietary choices including eating more fruits and vegetables and less fried foods. ?- I encouraged the patient to exercise more, and educated on the benefits of exercise including weight loss, diabetes prevention, and hypertension prevention.  ? Dietary counseling with a registered dietician ? Referral to a weight management support group (e.g. Weight Watchers, Overeaters Anonymous) ?? If your BMI is greater than 29 or you have gained more than 15 pounds you should work on weight loss. ?? Attend a healthy cooking class  ?  ?  ? ? ?No orders of the defined types were placed in this encounter. ? ? ?Follow-up: No follow-ups on file.   ? ? ?Cletis Athens, MD ?

## 2021-09-12 NOTE — Assessment & Plan Note (Signed)
Patient was advised to take 5 mg alternating with 2.5 mg of Coumadin daily ?

## 2021-09-12 NOTE — Assessment & Plan Note (Signed)

## 2021-09-22 ENCOUNTER — Ambulatory Visit (INDEPENDENT_AMBULATORY_CARE_PROVIDER_SITE_OTHER): Payer: HMO | Admitting: Nurse Practitioner

## 2021-09-22 ENCOUNTER — Other Ambulatory Visit: Payer: Self-pay

## 2021-09-22 ENCOUNTER — Encounter: Payer: Self-pay | Admitting: Nurse Practitioner

## 2021-09-22 VITALS — BP 130/80 | HR 60 | Temp 97.0°F | Ht 73.0 in | Wt 222.5 lb

## 2021-09-22 DIAGNOSIS — I4891 Unspecified atrial fibrillation: Secondary | ICD-10-CM

## 2021-09-22 DIAGNOSIS — J302 Other seasonal allergic rhinitis: Secondary | ICD-10-CM | POA: Diagnosis not present

## 2021-09-22 DIAGNOSIS — E139 Other specified diabetes mellitus without complications: Secondary | ICD-10-CM | POA: Diagnosis not present

## 2021-09-22 MED ORDER — LORATADINE 10 MG PO TABS: 10.0000 mg | ORAL_TABLET | Freq: Every day | ORAL | 1 refills | Status: AC

## 2021-09-22 NOTE — Assessment & Plan Note (Signed)
Patient was out of farxiga sample. ?Gave him samples of Jardiance 25 mg, take half tablet once a day. ?

## 2021-09-22 NOTE — Assessment & Plan Note (Signed)
Stable with medication.

## 2021-09-22 NOTE — Assessment & Plan Note (Signed)
Patient has seasonal allergies. ?Refilled loratadine 10 mg  ?

## 2021-09-22 NOTE — Progress Notes (Signed)
? ?Established Patient Office Visit ? ?Subjective:  ?Patient ID: Jesse Humphrey, male    DOB: 03/22/1956  Age: 66 y.o. MRN: [id number] ? ?CC: No chief complaint on file. ? ? ? ?HPI ? ?Naftali Sabree presents for diabetes medication. He is out of the sample for farxiga and is having some difficulty to get the medication refilled through his insurance.  ? ?HPI  ? ?Past Medical History:  ?Diagnosis Date  ? Atrial fibrillation ([facility])   ? Diabetes mellitus without complication ([facility])   ? Hypertension   ? ? ?History reviewed. No pertinent surgical history. ? ?History reviewed. No pertinent family history. ? ?Social History  ? ?Socioeconomic History  ? Marital status: Single  ?  Spouse name: Not on file  ? Number of children: Not on file  ? Years of education: Not on file  ? Highest education level: Not on file  ?Occupational History  ? Not on file  ?Tobacco Use  ? Smoking status: Never  ? Smokeless tobacco: Never  ?Substance and Sexual Activity  ? Alcohol use: Never  ? Drug use: Never  ? Sexual activity: Not on file  ?Other Topics Concern  ? Not on file  ?Social History Narrative  ? Not on file  ? ?Social Determinants of Health  ? ?Financial Resource Strain: Not on file  ?Food Insecurity: Not on file  ?Transportation Needs: Not on file  ?Physical Activity: Not on file  ?Stress: Not on file  ?Social Connections: Not on file  ?Intimate Partner Violence: Not on file  ? ? ? ?Outpatient Medications Prior to Visit  ?Medication Sig Dispense Refill  ? cloNIDine (CATAPRES) 0.2 MG tablet Take 1 tablet (0.2 mg total) by mouth 2 (two) times daily. 60 tablet 4  ? dapagliflozin propanediol (FARXIGA) 10 MG TABS tablet Take 1 tablet (10 mg total) by mouth daily before breakfast. 30 tablet 1  ? digoxin (LANOXIN) 0.25 MG tablet Take 1 tablet (250 mcg total) by mouth daily. 90 tablet 3  ? doxazosin (CARDURA) 4 MG tablet TAKE ONE TABLET BY MOUTH DAILY 30 tablet 4  ? furosemide (LASIX) 20 MG tablet TAKE ONE TABLET BY MOUTH DAILY 90 tablet 1  ?  glipiZIDE (GLUCOTROL XL) 10 MG 24 hr tablet TAKE ONE TABLET BY MOUTH DAILY 90 tablet 3  ? mupirocin ointment (BACTROBAN) 2 % Apply 1 application. topically 2 (two) times daily. 22 g 4  ? olmesartan (BENICAR) 40 MG tablet TAKE ONE TABLET BY MOUTH DAILY 90 tablet 3  ? pioglitazone (ACTOS) 45 MG tablet TAKE ONE TABLET BY MOUTH DAILY 90 tablet 3  ? warfarin (COUMADIN) 5 MG tablet TAKE ONE TABLET BY MOUTH DAILY 90 tablet 2  ? loratadine (CLARITIN) 10 MG tablet Take 10 mg by mouth daily.    ? ?Facility-Administered Medications Prior to Visit  ?Medication Dose Route Frequency Provider Last Rate Last Admin  ? aspirin chewable tablet 324 mg  324 mg Oral Once Kanady, Jarrod, FNP      ? ? ?No Known Allergies ? ?ROS ?Review of Systems  ?Constitutional:  Negative for activity change and appetite change.  ?HENT: Negative.    ?Eyes: Negative.   ?Respiratory:  Negative for cough and chest tightness.   ?Cardiovascular:  Negative for chest pain and palpitations.  ?Gastrointestinal:  Negative for abdominal distention, anal bleeding and constipation.  ?Genitourinary: Negative.   ?Musculoskeletal: Negative.   ?Skin:  Negative for color change and pallor.  ?Neurological:  Negative for dizziness, numbness and headaches.  ?Psychiatric/Behavioral:  Negative   for agitation, behavioral problems and confusion.   ? ?  ?Objective:  ?  ?Physical Exam ?Constitutional:   ?   Appearance: Normal appearance. He is obese.  ?HENT:  ?   Head: Normocephalic.  ?   Right Ear: Tympanic membrane normal.  ?   Left Ear: Tympanic membrane normal.  ?   Nose: Nose normal.  ?   Mouth/Throat:  ?   Mouth: Mucous membranes are moist.  ?Eyes:  ?   Extraocular Movements: Extraocular movements intact.  ?   Conjunctiva/sclera: Conjunctivae normal.  ?   Pupils: Pupils are equal, round, and reactive to light.  ?Cardiovascular:  ?   Rate and Rhythm: Normal rate. Rhythm irregular.  ?   Pulses: Normal pulses.  ?Pulmonary:  ?   Effort: Pulmonary effort is normal.  ?   Breath  sounds: Normal breath sounds.  ?Abdominal:  ?   General: Bowel sounds are normal.  ?   Palpations: Abdomen is soft.  ?Musculoskeletal:     ?   General: Normal range of motion.  ?   Cervical back: Normal range of motion.  ?Skin: ?   General: Skin is warm.  ?   Capillary Refill: Capillary refill takes less than 2 seconds.  ?Neurological:  ?   General: No focal deficit present.  ?   Mental Status: He is alert and oriented to person, place, and time. Mental status is at baseline.  ?Psychiatric:     ?   Mood and Affect: Mood normal.     ?   Behavior: Behavior normal.     ?   Thought Content: Thought content normal.     ?   Judgment: Judgment normal.  ? ? ?BP 130/80 (BP Location: Left Arm)   Pulse 60   Temp (!) 97 ?F (36.1 ?C) (Oral)   Ht 6' 1" (1.854 m)   Wt 222 lb 8 oz (100.9 kg)   BMI 29.36 kg/m?  ?Wt Readings from Last 3 Encounters:  ?09/22/21 222 lb 8 oz (100.9 kg)  ?09/12/21 219 lb 14.4 oz (99.7 kg)  ?08/29/21 227 lb 6.4 oz (103.1 kg)  ? ? ? ?Health Maintenance Due  ?Topic Date Due  ? FOOT EXAM  Never done  ? OPHTHALMOLOGY EXAM  Never done  ? HIV Screening  Never done  ? ? ?There are no preventive care reminders to display for this patient. ? ?Lab Results  ?Component Value Date  ? TSH 2.97 07/20/2020  ? ?Lab Results  ?Component Value Date  ? WBC 6.3 08/12/2020  ? HGB 14.4 08/12/2020  ? HCT 42.3 08/12/2020  ? MCV 86.2 08/12/2020  ? PLT 158 08/12/2020  ? ?Lab Results  ?Component Value Date  ? NA 135 01/24/2021  ? K 4.0 01/24/2021  ? CO2 27 01/24/2021  ? GLUCOSE 395 (H) 01/24/2021  ? BUN 20 01/24/2021  ? CREATININE 1.38 (H) 01/24/2021  ? BILITOT 1.3 (H) 01/24/2021  ? ALKPHOS 65 08/12/2020  ? AST 17 01/24/2021  ? ALT 13 01/24/2021  ? PROT 6.8 01/24/2021  ? ALBUMIN 4.2 08/12/2020  ? CALCIUM 9.1 01/24/2021  ? ANIONGAP 12 08/12/2020  ? EGFR 57 (L) 01/24/2021  ? ?Lab Results  ?Component Value Date  ? CHOL 220 (H) 07/20/2020  ? ?Lab Results  ?Component Value Date  ? HDL 35 (L) 07/20/2020  ? ?Lab Results  ?Component  Value Date  ? LDLCALC 161 (H) 07/20/2020  ? ?Lab Results  ?Component Value Date  ? TRIG 122 07/20/2020  ? ?  Lab Results  ?Component Value Date  ? CHOLHDL 6.3 (H) 07/20/2020  ? ?Lab Results  ?Component Value Date  ? HGBA1C 8.0 (A) 09/12/2021  ? ? ?  ?Assessment & Plan:  ? ?Problem List Items Addressed This Visit   ? ?  ? Cardiovascular and Mediastinum  ? Atrial fibrillation ([facility])  ?  Stable with medication. ?  ?  ?  ? Endocrine  ? Diabetes 1.5, managed as type 2 ([facility]) - Primary  ?  Patient was out of farxiga sample. ?Gave him samples of Jardiance 25 mg, take half tablet once a day. ?  ?  ?  ? Other  ? Seasonal allergies  ?  Patient has seasonal allergies. ?Refilled loratadine 10 mg  ?  ?  ? ? ? ?Meds ordered this encounter  ?Medications  ? loratadine (CLARITIN) 10 MG tablet  ?  Sig: Take 1 tablet (10 mg total) by mouth daily.  ?  Dispense:  30 tablet  ?  Refill:  1  ? ? ? ?Follow-up: No follow-ups on file.  ? ? ?Charanpreet  Kaur, NP ?

## 2021-09-28 ENCOUNTER — Other Ambulatory Visit: Payer: Self-pay | Admitting: Internal Medicine

## 2021-10-09 ENCOUNTER — Ambulatory Visit (INDEPENDENT_AMBULATORY_CARE_PROVIDER_SITE_OTHER): Payer: HMO | Admitting: Internal Medicine

## 2021-10-09 ENCOUNTER — Encounter: Payer: Self-pay | Admitting: Internal Medicine

## 2021-10-09 VITALS — BP 124/81 | HR 75 | Ht 73.0 in | Wt 220.2 lb

## 2021-10-09 DIAGNOSIS — I4891 Unspecified atrial fibrillation: Secondary | ICD-10-CM

## 2021-10-09 DIAGNOSIS — E669 Obesity, unspecified: Secondary | ICD-10-CM

## 2021-10-09 DIAGNOSIS — E139 Other specified diabetes mellitus without complications: Secondary | ICD-10-CM

## 2021-10-09 DIAGNOSIS — I119 Hypertensive heart disease without heart failure: Secondary | ICD-10-CM | POA: Diagnosis not present

## 2021-10-09 LAB — POCT INR: INR: 1.7 — AB (ref 2.0–3.0)

## 2021-10-09 LAB — GLUCOSE, POCT (MANUAL RESULT ENTRY): POC Glucose: 140 mg/dl — AB (ref 70–99)

## 2021-10-09 NOTE — Assessment & Plan Note (Signed)

## 2021-10-09 NOTE — Assessment & Plan Note (Signed)

## 2021-10-09 NOTE — Assessment & Plan Note (Signed)
Patient weight is stable

## 2021-10-09 NOTE — Progress Notes (Signed)
? ?Established Patient Office Visit ? ?Subjective:  ?Patient ID: Jesse Humphrey, male    DOB: 04/18/1956  Age: 66 y.o. MRN: [id number] ? ?CC:  ?Chief Complaint  ?Patient presents with  ? Diabetes  ? Atrial Fibrillation  ?  INR recheck  ? ? ?Diabetes ? ?Atrial Fibrillation ?Past medical history includes atrial fibrillation.  ? ?Jesse Humphrey presents for check up ? ?Past Medical History:  ?Diagnosis Date  ? Atrial fibrillation ([place])   ? Diabetes mellitus without complication ([place])   ? Hypertension   ? ? ?History reviewed. No pertinent surgical history. ? ?History reviewed. No pertinent family history. ? ?Social History  ? ?Socioeconomic History  ? Marital status: Single  ?  Spouse name: Not on file  ? Number of children: Not on file  ? Years of education: Not on file  ? Highest education level: Not on file  ?Occupational History  ? Not on file  ?Tobacco Use  ? Smoking status: Never  ? Smokeless tobacco: Never  ?Substance and Sexual Activity  ? Alcohol use: Never  ? Drug use: Never  ? Sexual activity: Not on file  ?Other Topics Concern  ? Not on file  ?Social History Narrative  ? Not on file  ? ?Social Determinants of Health  ? ?Financial Resource Strain: Not on file  ?Food Insecurity: Not on file  ?Transportation Needs: Not on file  ?Physical Activity: Not on file  ?Stress: Not on file  ?Social Connections: Not on file  ?Intimate Partner Violence: Not on file  ? ? ? ?Current Outpatient Medications:  ?  cloNIDine (CATAPRES) 0.2 MG tablet, Take 1 tablet (0.2 mg total) by mouth 2 (two) times daily., Disp: 60 tablet, Rfl: 4 ?  dapagliflozin propanediol (FARXIGA) 10 MG TABS tablet, Take 1 tablet (10 mg total) by mouth daily before breakfast., Disp: 30 tablet, Rfl: 1 ?  digoxin (LANOXIN) 0.25 MG tablet, Take 1 tablet (250 mcg total) by mouth daily., Disp: 90 tablet, Rfl: 3 ?  doxazosin (CARDURA) 4 MG tablet, TAKE ONE TABLET BY MOUTH DAILY, Disp: 30 tablet, Rfl: 4 ?  furosemide (LASIX) 20 MG tablet, TAKE ONE TABLET BY MOUTH  DAILY, Disp: 90 tablet, Rfl: 1 ?  glipiZIDE (GLUCOTROL XL) 10 MG 24 hr tablet, TAKE ONE TABLET BY MOUTH DAILY, Disp: 90 tablet, Rfl: 3 ?  loratadine (CLARITIN) 10 MG tablet, Take 1 tablet (10 mg total) by mouth daily., Disp: 30 tablet, Rfl: 1 ?  mupirocin ointment (BACTROBAN) 2 %, Apply 1 application. topically 2 (two) times daily., Disp: 22 g, Rfl: 4 ?  olmesartan (BENICAR) 40 MG tablet, TAKE ONE TABLET BY MOUTH DAILY, Disp: 90 tablet, Rfl: 3 ?  pioglitazone (ACTOS) 45 MG tablet, TAKE ONE TABLET BY MOUTH DAILY, Disp: 90 tablet, Rfl: 3 ?  warfarin (COUMADIN) 5 MG tablet, TAKE ONE TABLET BY MOUTH DAILY, Disp: 90 tablet, Rfl: 2 ? ?Current Facility-Administered Medications:  ?  aspirin chewable tablet 324 mg, 324 mg, Oral, Once, Beckie Salts, FNP  ? ?No Known Allergies ? ?ROS ?Review of Systems  ?Constitutional: Negative.   ?HENT: Negative.    ?Eyes: Negative.   ?Respiratory: Negative.    ?Cardiovascular: Negative.   ?Gastrointestinal: Negative.   ?Endocrine: Negative.   ?Genitourinary: Negative.   ?Musculoskeletal: Negative.   ?Skin: Negative.   ?Allergic/Immunologic: Negative.   ?Neurological: Negative.   ?Hematological: Negative.   ?Psychiatric/Behavioral: Negative.    ?All other systems reviewed and are negative. ? ?  ?Objective:  ?  ?Physical Exam ?Vitals reviewed.  ?  Constitutional:   ?   Appearance: Normal appearance.  ?HENT:  ?   Mouth/Throat:  ?   Mouth: Mucous membranes are moist.  ?Eyes:  ?   Pupils: Pupils are equal, round, and reactive to light.  ?Neck:  ?   Vascular: No carotid bruit.  ?Cardiovascular:  ?   Rate and Rhythm: Normal rate and regular rhythm.  ?   Pulses: Normal pulses.  ?   Heart sounds: Normal heart sounds.  ?Pulmonary:  ?   Effort: Pulmonary effort is normal.  ?   Breath sounds: Normal breath sounds.  ?Abdominal:  ?   General: Bowel sounds are normal.  ?   Palpations: Abdomen is soft. There is no hepatomegaly, splenomegaly or mass.  ?   Tenderness: There is no abdominal tenderness.  ?    Hernia: No hernia is present.  ?Musculoskeletal:  ?   Cervical back: Neck supple.  ?   Right lower leg: No edema.  ?   Left lower leg: No edema.  ?Skin: ?   Findings: No rash.  ?Neurological:  ?   Mental Status: He is alert and oriented to person, place, and time.  ?   Motor: No weakness.  ?Psychiatric:     ?   Mood and Affect: Mood normal.     ?   Behavior: Behavior normal.  ? ? ?BP 124/81   Pulse 75   Ht _0  (1.854 m)   Wt 220 lb 3.2 oz (99.9 kg)   BMI 29.05 kg/m?  ?Wt Readings from Last 3 Encounters:  ?10/09/21 220 lb 3.2 oz (99.9 kg)  ?09/22/21 222 lb 8 oz (100.9 kg)  ?09/12/21 219 lb 14.4 oz (99.7 kg)  ? ? ? ?Health Maintenance Due  ?Topic Date Due  ? COVID-19 Vaccine (1) Never done  ? FOOT EXAM  Never done  ? OPHTHALMOLOGY EXAM  Never done  ? HIV Screening  Never done  ? ? ?There are no preventive care reminders to display for this patient. ? ?Lab Results  ?Component Value Date  ? TSH 2.97 07/20/2020  ? ?Lab Results  ?Component Value Date  ? WBC 6.3 08/12/2020  ? HGB 14.4 08/12/2020  ? HCT 42.3 08/12/2020  ? MCV 86.2 08/12/2020  ? PLT 158 08/12/2020  ? ?Lab Results  ?Component Value Date  ? NA 135 01/24/2021  ? K 4.0 01/24/2021  ? CO2 27 01/24/2021  ? GLUCOSE 395 (H) 01/24/2021  ? BUN 20 01/24/2021  ? CREATININE 1.38 (H) 01/24/2021  ? BILITOT 1.3 (H) 01/24/2021  ? ALKPHOS 65 08/12/2020  ? AST 17 01/24/2021  ? ALT 13 01/24/2021  ? PROT 6.8 01/24/2021  ? ALBUMIN 4.2 08/12/2020  ? CALCIUM 9.1 01/24/2021  ? ANIONGAP 12 08/12/2020  ? EGFR 57 (L) 01/24/2021  ? ?Lab Results  ?Component Value Date  ? CHOL 220 (H) 07/20/2020  ? ?Lab Results  ?Component Value Date  ? HDL 35 (L) 07/20/2020  ? ?Lab Results  ?Component Value Date  ? LDLCALC 161 (H) 07/20/2020  ? ?Lab Results  ?Component Value Date  ? TRIG 122 07/20/2020  ? ?Lab Results  ?Component Value Date  ? CHOLHDL 6.3 (H) 07/20/2020  ? ?Lab Results  ?Component Value Date  ? HGBA1C 8.0 (A) 09/12/2021  ? ? ?  ?Assessment & Plan:  ? ?Problem List Items Addressed  This Visit   ? ?  ? Cardiovascular and Mediastinum  ? Hypertensive heart disease  ?   Patient denies any chest pain  or shortness of breath there is no history of palpitation or paroxysmal nocturnal dyspnea ?  patient was advised to follow low-salt low-cholesterol diet ? ?  ideally I want to keep systolic blood pressure below 130 mmHg, patient was asked to check blood pressure one times a week and give me a report on that.  Patient will be follow-up in 3 months  or earlier as needed, patient will call me back for any change in the cardiovascular symptoms ?Patient was advised to buy a book from local bookstore concerning blood pressure and read several chapters  every day.  This will be supplemented by some of the material we will give him from the office.  Patient should also utilize other resources like YouTube and Internet to learn more about the blood pressure and the diet. ?  ?  ? Atrial fibrillation ([place])  ?  Patient is taking warfarin ?  ?  ? Relevant Orders  ? POCT INR (Completed)  ?  ? Endocrine  ? 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  - The patient's blood sugar is labile on med. ?- The patient will continue the current treatment regimen.  ?- I encouraged the patient to regularly check blood sugar.  ?- I encouraged the patient to monitor diet. I encouraged the patient to eat low-carb and low-sugar to help prevent blood sugar spikes.  ?- I encouraged the patient to continue following their prescribed treatment plan for diabetes ?- I informed the patient to get help if blood sugar drops below 11m/dL, or if suddenly have trouble thinking clearly or breathing.  ?Patient was advised to buy a book on diabetes from a local bookstore or from [place]  Patient should read 2 chapters every day to keep the motivation going, this is in addition to some of the materials we provided them from the office.  There are other resources on the Internet like YouTube and [employer] to get an education on the  diabetes ?  ?  ? Relevant Orders  ? POCT glucose (manual entry) (Completed)  ?  ? Other  ? Obesity (BMI 30-39.9)  ?  Patient weight is stable. ?  ?  ? ? ?No orders of the defined types were placed in this encounte

## 2021-10-09 NOTE — Assessment & Plan Note (Signed)
Patient is taking warfarin ?

## 2021-10-23 ENCOUNTER — Other Ambulatory Visit: Payer: Self-pay | Admitting: *Deleted

## 2021-10-23 MED ORDER — DAPAGLIFLOZIN PROPANEDIOL 10 MG PO TABS: 10.0000 mg | ORAL_TABLET | Freq: Every day | ORAL | 1 refills | Status: DC

## 2021-10-27 ENCOUNTER — Other Ambulatory Visit (INDEPENDENT_AMBULATORY_CARE_PROVIDER_SITE_OTHER): Payer: HMO

## 2021-10-27 DIAGNOSIS — I4891 Unspecified atrial fibrillation: Secondary | ICD-10-CM

## 2021-10-27 LAB — POCT INR: INR: 1.9 — AB (ref 2.0–3.0)

## 2021-11-20 DIAGNOSIS — E119 Type 2 diabetes mellitus without complications: Secondary | ICD-10-CM | POA: Diagnosis not present

## 2021-11-20 DIAGNOSIS — E782 Mixed hyperlipidemia: Secondary | ICD-10-CM | POA: Diagnosis not present

## 2021-11-20 DIAGNOSIS — E669 Obesity, unspecified: Secondary | ICD-10-CM | POA: Diagnosis not present

## 2021-11-20 DIAGNOSIS — I4891 Unspecified atrial fibrillation: Secondary | ICD-10-CM | POA: Diagnosis not present

## 2021-11-20 DIAGNOSIS — I340 Nonrheumatic mitral (valve) insufficiency: Secondary | ICD-10-CM | POA: Diagnosis not present

## 2021-11-20 DIAGNOSIS — I10 Essential (primary) hypertension: Secondary | ICD-10-CM | POA: Diagnosis not present

## 2021-12-08 DIAGNOSIS — I4891 Unspecified atrial fibrillation: Secondary | ICD-10-CM | POA: Diagnosis not present

## 2021-12-14 ENCOUNTER — Encounter: Payer: Self-pay | Admitting: Nurse Practitioner

## 2021-12-14 ENCOUNTER — Ambulatory Visit (INDEPENDENT_AMBULATORY_CARE_PROVIDER_SITE_OTHER): Payer: HMO | Admitting: Nurse Practitioner

## 2021-12-14 VITALS — BP 149/80 | HR 73 | Ht 73.0 in | Wt 221.9 lb

## 2021-12-14 DIAGNOSIS — R21 Rash and other nonspecific skin eruption: Secondary | ICD-10-CM

## 2021-12-14 MED ORDER — CLOTRIMAZOLE-BETAMETHASONE 1-0.05 % EX CREA: 1.0000 | TOPICAL_CREAM | Freq: Two times a day (BID) | CUTANEOUS | 0 refills | Status: DC

## 2021-12-14 NOTE — Progress Notes (Signed)
Established Patient Office Visit  Subjective:  Patient ID: Jesse Humphrey, male    DOB: 02-18-1956  Age: 66 y.o. MRN: [id number]  CC:  Chief Complaint  Patient presents with   Herpes Zoster    Patient reports possible shingles      HPI  Jesse Humphrey presents for rash near right under arm. Patient states he might had chigger bites.   Rash This is a new problem. The current episode started in the past 7 days. The problem is unchanged. The affected locations include the right axilla. The rash is characterized by redness and itchiness. He was exposed to an insect bite/sting. Pertinent negatives include no anorexia, cough, joint pain, shortness of breath or sore throat. Past treatments include anti-itch cream. The treatment provided mild relief.     Past Medical History:  Diagnosis Date   Atrial fibrillation ([place])    Diabetes mellitus without complication ([place])    Hypertension     History reviewed. No pertinent surgical history.  History reviewed. No pertinent family history.  Social History   Socioeconomic History   Marital status: Single    Spouse name: Not on file   Number of children: Not on file   Years of education: Not on file   Highest education level: Not on file  Occupational History   Not on file  Tobacco Use   Smoking status: Never   Smokeless tobacco: Never  Substance and Sexual Activity   Alcohol use: Never   Drug use: Never   Sexual activity: Not on file  Other Topics Concern   Not on file  Social History Narrative   Not on file   Social Determinants of Health   Financial Resource Strain: Not on file  Food Insecurity: Not on file  Transportation Needs: Not on file  Physical Activity: Not on file  Stress: Not on file  Social Connections: Not on file  Intimate Partner Violence: Not on file     Outpatient Medications Prior to Visit  Medication Sig Dispense Refill   cloNIDine (CATAPRES) 0.2 MG tablet Take 1 tablet (0.2 mg total) by mouth  2 (two) times daily. 60 tablet 4   dapagliflozin propanediol (FARXIGA) 10 MG TABS tablet Take 1 tablet (10 mg total) by mouth daily before breakfast. 90 tablet 1   digoxin (LANOXIN) 0.25 MG tablet Take 1 tablet (250 mcg total) by mouth daily. 90 tablet 3   doxazosin (CARDURA) 4 MG tablet TAKE ONE TABLET BY MOUTH DAILY 30 tablet 4   furosemide (LASIX) 20 MG tablet TAKE ONE TABLET BY MOUTH DAILY 90 tablet 1   glipiZIDE (GLUCOTROL XL) 10 MG 24 hr tablet TAKE ONE TABLET BY MOUTH DAILY 90 tablet 3   loratadine (CLARITIN) 10 MG tablet Take 1 tablet (10 mg total) by mouth daily. 30 tablet 1   mupirocin ointment (BACTROBAN) 2 % Apply 1 application. topically 2 (two) times daily. 22 g 4   olmesartan (BENICAR) 40 MG tablet TAKE ONE TABLET BY MOUTH DAILY 90 tablet 3   pioglitazone (ACTOS) 45 MG tablet TAKE ONE TABLET BY MOUTH DAILY 90 tablet 3   warfarin (COUMADIN) 5 MG tablet TAKE ONE TABLET BY MOUTH DAILY 90 tablet 2   Facility-Administered Medications Prior to Visit  Medication Dose Route Frequency Provider Last Rate Last Admin   aspirin chewable tablet 324 mg  324 mg Oral Once Beckie Salts, FNP        No Known Allergies  ROS Review of Systems  Constitutional:  Negative for  activity change and appetite change.  HENT: Negative.  Negative for sore throat.   Eyes: Negative.   Respiratory:  Negative for cough, chest tightness and shortness of breath.   Cardiovascular:  Negative for chest pain and palpitations.  Gastrointestinal:  Negative for abdominal distention, anal bleeding, anorexia and constipation.  Genitourinary: Negative.   Musculoskeletal: Negative.  Negative for joint pain.  Skin:  Positive for rash. Negative for color change and pallor.  Neurological:  Negative for dizziness, numbness and headaches.  Psychiatric/Behavioral:  Negative for agitation, behavioral problems and confusion.       Objective:    Physical Exam Constitutional:      Appearance: Normal appearance. He is  obese.  HENT:     Head: Normocephalic.     Right Ear: Tympanic membrane normal.     Left Ear: Tympanic membrane normal.     Nose: Nose normal.     Mouth/Throat:     Mouth: Mucous membranes are moist.  Eyes:     Extraocular Movements: Extraocular movements intact.     Conjunctiva/sclera: Conjunctivae normal.     Pupils: Pupils are equal, round, and reactive to light.  Cardiovascular:     Rate and Rhythm: Normal rate. Rhythm irregular.     Pulses: Normal pulses.  Pulmonary:     Effort: Pulmonary effort is normal.     Breath sounds: Normal breath sounds.  Abdominal:     General: Bowel sounds are normal.     Palpations: Abdomen is soft.  Musculoskeletal:        General: Normal range of motion.     Cervical back: Normal range of motion.     Comments: Rash near the right armpit  Skin:    General: Skin is warm.     Capillary Refill: Capillary refill takes less than 2 seconds.     Findings: Erythema and rash present. Rash is macular and papular.  Neurological:     General: No focal deficit present.     Mental Status: He is alert and oriented to person, place, and time. Mental status is at baseline.  Psychiatric:        Mood and Affect: Mood normal.        Behavior: Behavior normal.        Thought Content: Thought content normal.        Judgment: Judgment normal.     BP (!) 149/80   Pulse 73   Ht '6\' 1"'  (1.854 m)   Wt 221 lb 14.4 oz (100.7 kg)   BMI 29.28 kg/m  Wt Readings from Last 3 Encounters:  12/14/21 221 lb 14.4 oz (100.7 kg)  10/09/21 220 lb 3.2 oz (99.9 kg)  09/22/21 222 lb 8 oz (100.9 kg)     Health Maintenance Due  Topic Date Due   COVID-19 Vaccine (1) Never done   FOOT EXAM  Never done   OPHTHALMOLOGY EXAM  Never done   HIV Screening  Never done   Zoster Vaccines- Shingrix (1 of 2) Never done    There are no preventive care reminders to display for this patient.  Lab Results  Component Value Date   TSH 2.97 07/20/2020   Lab Results  Component  Value Date   WBC 6.3 08/12/2020   HGB 14.4 08/12/2020   HCT 42.3 08/12/2020   MCV 86.2 08/12/2020   PLT 158 08/12/2020   Lab Results  Component Value Date   NA 135 01/24/2021   K 4.0 01/24/2021   CO2 27 01/24/2021  GLUCOSE 395 (H) 01/24/2021   BUN 20 01/24/2021   CREATININE 1.38 (H) 01/24/2021   BILITOT 1.3 (H) 01/24/2021   ALKPHOS 65 08/12/2020   AST 17 01/24/2021   ALT 13 01/24/2021   PROT 6.8 01/24/2021   ALBUMIN 4.2 08/12/2020   CALCIUM 9.1 01/24/2021   ANIONGAP 12 08/12/2020   EGFR 57 (L) 01/24/2021   Lab Results  Component Value Date   CHOL 220 (H) 07/20/2020   Lab Results  Component Value Date   HDL 35 (L) 07/20/2020   Lab Results  Component Value Date   LDLCALC 161 (H) 07/20/2020   Lab Results  Component Value Date   TRIG 122 07/20/2020   Lab Results  Component Value Date   CHOLHDL 6.3 (H) 07/20/2020   Lab Results  Component Value Date   HGBA1C 8.0 (A) 09/12/2021      Assessment & Plan:   Problem List Items Addressed This Visit       Musculoskeletal and Integument   Rash - Primary    Started him on Lotrisone cream.         Meds ordered this encounter  Medications   clotrimazole-betamethasone (LOTRISONE) cream    Sig: Apply 1 Application topically 2 (two) times daily.    Dispense:  30 g    Refill:  0     Follow-up: No follow-ups on file.    Theresia Lo, NP

## 2021-12-14 NOTE — Assessment & Plan Note (Signed)
Started him on Lotrisone cream.

## 2021-12-15 ENCOUNTER — Ambulatory Visit: Payer: HMO | Admitting: Nurse Practitioner

## 2021-12-19 LAB — HM DIABETES EYE EXAM

## 2021-12-21 DIAGNOSIS — I10 Essential (primary) hypertension: Secondary | ICD-10-CM | POA: Diagnosis not present

## 2021-12-21 DIAGNOSIS — E119 Type 2 diabetes mellitus without complications: Secondary | ICD-10-CM | POA: Diagnosis not present

## 2021-12-21 DIAGNOSIS — E669 Obesity, unspecified: Secondary | ICD-10-CM | POA: Diagnosis not present

## 2021-12-21 DIAGNOSIS — I340 Nonrheumatic mitral (valve) insufficiency: Secondary | ICD-10-CM | POA: Diagnosis not present

## 2021-12-21 DIAGNOSIS — E782 Mixed hyperlipidemia: Secondary | ICD-10-CM | POA: Diagnosis not present

## 2021-12-21 DIAGNOSIS — I4891 Unspecified atrial fibrillation: Secondary | ICD-10-CM | POA: Diagnosis not present

## 2021-12-25 ENCOUNTER — Other Ambulatory Visit: Payer: Self-pay | Admitting: Internal Medicine

## 2022-01-03 ENCOUNTER — Encounter: Payer: Self-pay | Admitting: Internal Medicine

## 2022-01-24 DIAGNOSIS — H40023 Open angle with borderline findings, high risk, bilateral: Secondary | ICD-10-CM | POA: Diagnosis not present

## 2022-01-24 DIAGNOSIS — H5203 Hypermetropia, bilateral: Secondary | ICD-10-CM | POA: Diagnosis not present

## 2022-01-24 DIAGNOSIS — H52223 Regular astigmatism, bilateral: Secondary | ICD-10-CM | POA: Diagnosis not present

## 2022-01-24 DIAGNOSIS — H2513 Age-related nuclear cataract, bilateral: Secondary | ICD-10-CM | POA: Diagnosis not present

## 2022-01-24 DIAGNOSIS — E119 Type 2 diabetes mellitus without complications: Secondary | ICD-10-CM | POA: Diagnosis not present

## 2022-01-24 DIAGNOSIS — H524 Presbyopia: Secondary | ICD-10-CM | POA: Diagnosis not present

## 2022-01-24 DIAGNOSIS — Z7984 Long term (current) use of oral hypoglycemic drugs: Secondary | ICD-10-CM | POA: Diagnosis not present

## 2022-03-07 IMAGING — DX DG CHEST 1V PORT
1 series · 1 of 1 positions shown · non-contrast
Comparison: None.

CLINICAL DATA: Shortness of breath.  Atrial fibrillation

EXAM:
PORTABLE CHEST 1 VIEW

[chest ap]
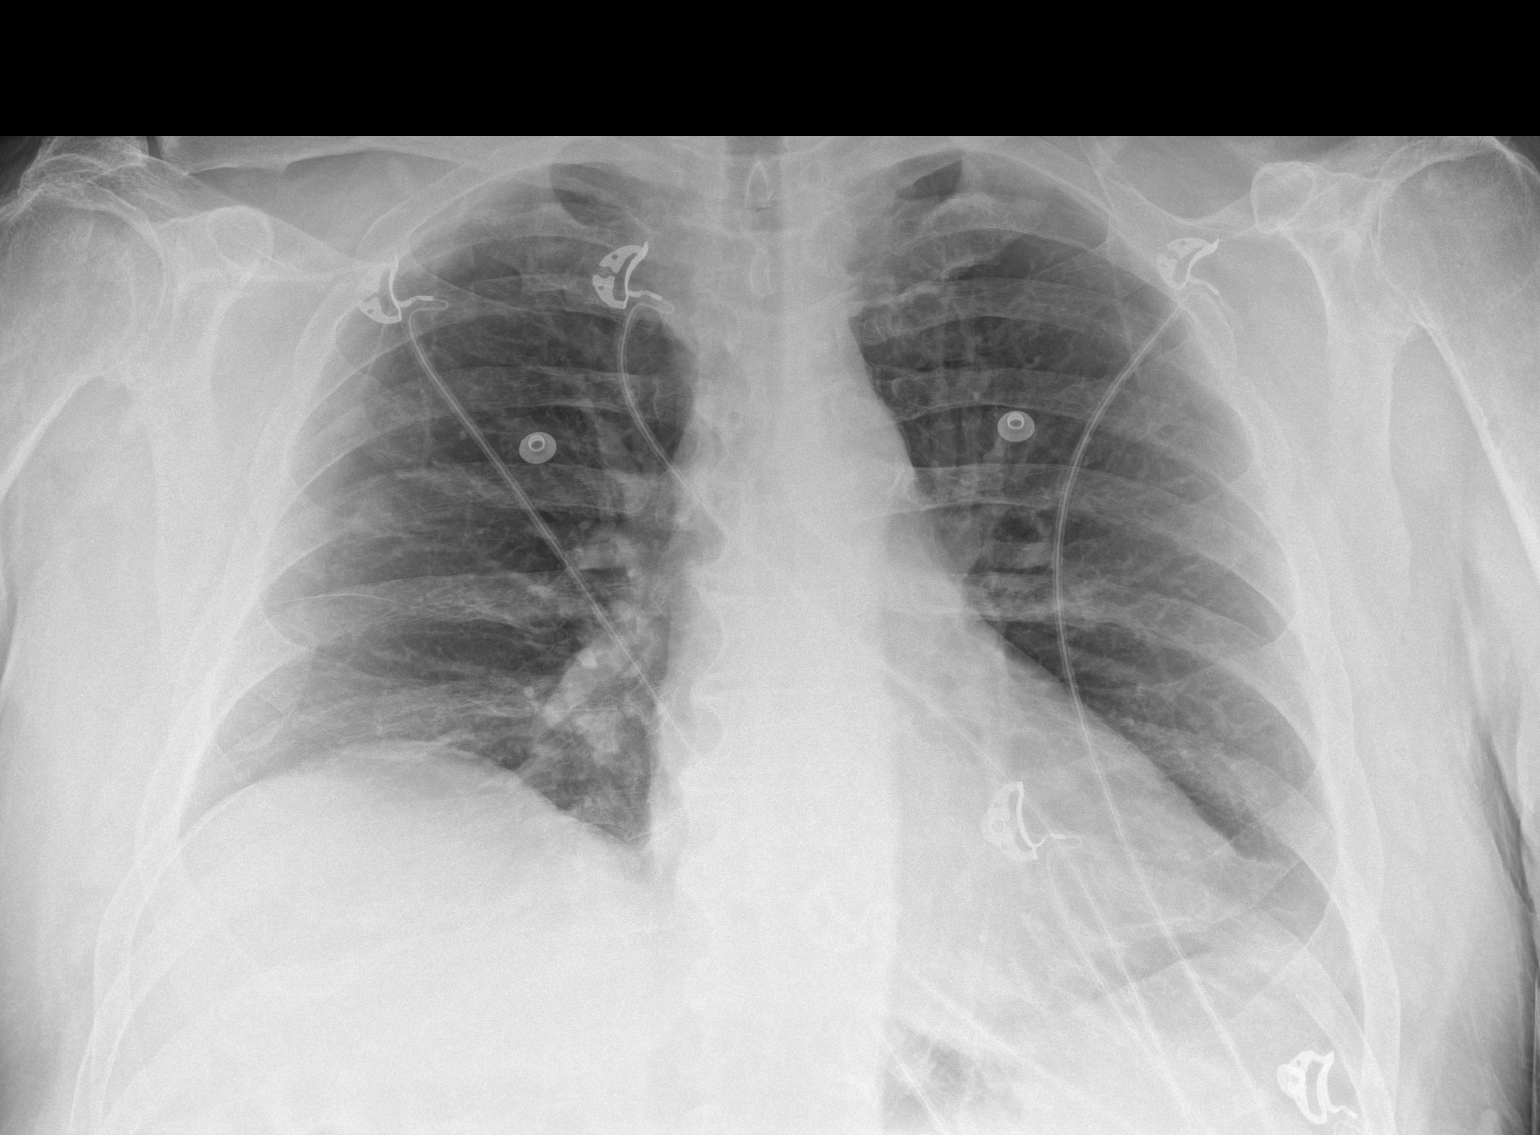

[1 of 1 positions shown; findings below may reference images not displayed]

FINDINGS: There is mild atelectasis in the right base. The lungs elsewhere are
clear. Heart size and pulmonary vascularity are normal. No
adenopathy. No bone lesions.
IMPRESSION: Mild right base atelectasis. Lungs otherwise clear. Heart size
within normal limits.

## 2022-03-10 ENCOUNTER — Other Ambulatory Visit: Payer: Self-pay | Admitting: Internal Medicine

## 2022-03-12 ENCOUNTER — Other Ambulatory Visit: Payer: Self-pay | Admitting: *Deleted

## 2022-03-12 MED ORDER — GLIPIZIDE ER 10 MG PO TB24: 10.0000 mg | ORAL_TABLET | Freq: Every day | ORAL | 3 refills | Status: DC

## 2022-04-10 DIAGNOSIS — H40023 Open angle with borderline findings, high risk, bilateral: Secondary | ICD-10-CM | POA: Diagnosis not present

## 2022-04-10 DIAGNOSIS — H2513 Age-related nuclear cataract, bilateral: Secondary | ICD-10-CM | POA: Diagnosis not present

## 2022-04-10 DIAGNOSIS — Z7984 Long term (current) use of oral hypoglycemic drugs: Secondary | ICD-10-CM | POA: Diagnosis not present

## 2022-04-10 DIAGNOSIS — E119 Type 2 diabetes mellitus without complications: Secondary | ICD-10-CM | POA: Diagnosis not present

## 2022-04-16 DIAGNOSIS — H40023 Open angle with borderline findings, high risk, bilateral: Secondary | ICD-10-CM | POA: Diagnosis not present

## 2022-04-16 DIAGNOSIS — H16141 Punctate keratitis, right eye: Secondary | ICD-10-CM | POA: Diagnosis not present

## 2022-04-16 DIAGNOSIS — H1131 Conjunctival hemorrhage, right eye: Secondary | ICD-10-CM | POA: Diagnosis not present

## 2022-04-16 DIAGNOSIS — E119 Type 2 diabetes mellitus without complications: Secondary | ICD-10-CM | POA: Diagnosis not present

## 2022-04-16 DIAGNOSIS — Z7984 Long term (current) use of oral hypoglycemic drugs: Secondary | ICD-10-CM | POA: Diagnosis not present

## 2022-04-16 DIAGNOSIS — H2513 Age-related nuclear cataract, bilateral: Secondary | ICD-10-CM | POA: Diagnosis not present

## 2022-04-27 ENCOUNTER — Other Ambulatory Visit: Payer: Self-pay | Admitting: *Deleted

## 2022-04-27 MED ORDER — DAPAGLIFLOZIN PROPANEDIOL 10 MG PO TABS: 10.0000 mg | ORAL_TABLET | Freq: Every day | ORAL | 3 refills | Status: DC

## 2022-05-01 ENCOUNTER — Encounter: Payer: Self-pay | Admitting: Internal Medicine

## 2022-05-01 ENCOUNTER — Ambulatory Visit (INDEPENDENT_AMBULATORY_CARE_PROVIDER_SITE_OTHER): Payer: PPO | Admitting: Internal Medicine

## 2022-05-01 VITALS — BP 137/80 | HR 76 | Ht 73.0 in | Wt 228.1 lb

## 2022-05-01 DIAGNOSIS — E669 Obesity, unspecified: Secondary | ICD-10-CM | POA: Diagnosis not present

## 2022-05-01 DIAGNOSIS — J302 Other seasonal allergic rhinitis: Secondary | ICD-10-CM

## 2022-05-01 DIAGNOSIS — E139 Other specified diabetes mellitus without complications: Secondary | ICD-10-CM | POA: Diagnosis not present

## 2022-05-01 DIAGNOSIS — I4891 Unspecified atrial fibrillation: Secondary | ICD-10-CM

## 2022-05-01 LAB — POCT INR: POC INR: 2.3

## 2022-05-01 LAB — POCT GLYCOSYLATED HEMOGLOBIN (HGB A1C): HbA1c POC (<> result, manual entry): 7.5 % (ref 4.0–5.6)

## 2022-05-01 LAB — GLUCOSE, POCT (MANUAL RESULT ENTRY): POC Glucose: 120 mg/dl — AB (ref 70–99)

## 2022-05-01 NOTE — Assessment & Plan Note (Signed)
Stable

## 2022-05-01 NOTE — Assessment & Plan Note (Signed)
Take Claritin 10 mg p.o. daily 

## 2022-05-01 NOTE — Assessment & Plan Note (Signed)

## 2022-05-01 NOTE — Assessment & Plan Note (Signed)

## 2022-05-01 NOTE — Progress Notes (Signed)
Established Patient Office Visit  Subjective:  Patient ID: Jesse Humphrey, male    DOB: 1956-01-22  Age: 66 y.o. MRN: [id number]  CC:  Chief Complaint  Patient presents with   Atrial Fibrillation    Atrial Fibrillation Past medical history includes atrial fibrillation.    Jesse Humphrey presents for protime and bp check stable at the present tim stable at the present time at the present time  Past Medical History:  Diagnosis Date   Atrial fibrillation ([place])    Diabetes mellitus without complication ([place])    Hypertension     History reviewed. No pertinent surgical history.  History reviewed. No pertinent family history.  Social History   Socioeconomic History   Marital status: Single    Spouse name: Not on file   Number of children: Not on file   Years of education: Not on file   Highest education level: Not on file  Occupational History   Not on file  Tobacco Use   Smoking status: Never   Smokeless tobacco: Never  Substance and Sexual Activity   Alcohol use: Never   Drug use: Never   Sexual activity: Not on file  Other Topics Concern   Not on file  Social History Narrative   Not on file   Social Determinants of Health   Financial Resource Strain: Not on file  Food Insecurity: Not on file  Transportation Needs: Not on file  Physical Activity: Not on file  Stress: Not on file  Social Connections: Not on file  Intimate Partner Violence: Not on file     Current Outpatient Medications:    clotrimazole-betamethasone (LOTRISONE) cream, Apply 1 Application topically 2 (two) times daily., Disp: 30 g, Rfl: 0   dapagliflozin propanediol (FARXIGA) 10 MG TABS tablet, Take 1 tablet (10 mg total) by mouth daily before breakfast., Disp: 90 tablet, Rfl: 3   digoxin (LANOXIN) 0.25 MG tablet, Take 1 tablet (250 mcg total) by mouth daily., Disp: 90 tablet, Rfl: 3   doxazosin (CARDURA) 4 MG tablet, TAKE ONE TABLET BY MOUTH DAILY, Disp: 90 tablet, Rfl: 3   glipiZIDE  (GLUCOTROL XL) 10 MG 24 hr tablet, Take 1 tablet (10 mg total) by mouth daily., Disp: 90 tablet, Rfl: 3   loratadine (CLARITIN) 10 MG tablet, Take 1 tablet (10 mg total) by mouth daily., Disp: 30 tablet, Rfl: 1   mupirocin ointment (BACTROBAN) 2 %, Apply 1 application. topically 2 (two) times daily., Disp: 22 g, Rfl: 4   olmesartan (BENICAR) 40 MG tablet, TAKE ONE TABLET BY MOUTH DAILY, Disp: 90 tablet, Rfl: 3   pioglitazone (ACTOS) 45 MG tablet, TAKE ONE TABLET BY MOUTH DAILY, Disp: 90 tablet, Rfl: 3   warfarin (COUMADIN) 5 MG tablet, TAKE ONE TABLET BY MOUTH DAILY, Disp: 90 tablet, Rfl: 2   No Known Allergies  ROS Review of Systems  Constitutional: Negative.   HENT: Negative.    Eyes: Negative.   Respiratory: Negative.    Cardiovascular: Negative.   Gastrointestinal: Negative.   Endocrine: Negative.   Genitourinary: Negative.   Musculoskeletal: Negative.   Skin: Negative.   Allergic/Immunologic: Negative.   Neurological: Negative.   Hematological: Negative.   Psychiatric/Behavioral: Negative.    All other systems reviewed and are negative.     Objective:    Physical Exam Vitals reviewed.  Constitutional:      Appearance: Normal appearance.  HENT:     Mouth/Throat:     Mouth: Mucous membranes are moist.  Eyes:  Pupils: Pupils are equal, round, and reactive to light.  Neck:     Vascular: No carotid bruit.  Cardiovascular:     Rate and Rhythm: Normal rate and regular rhythm.     Pulses: Normal pulses.     Heart sounds: Normal heart sounds.  Pulmonary:     Effort: Pulmonary effort is normal.     Breath sounds: Normal breath sounds.  Abdominal:     General: Bowel sounds are normal.     Palpations: Abdomen is soft. There is no hepatomegaly, splenomegaly or mass.     Tenderness: There is no abdominal tenderness.     Hernia: No hernia is present.  Musculoskeletal:     Cervical back: Neck supple.     Right lower leg: No edema.     Left lower leg: No edema.   Skin:    Findings: No rash.  Neurological:     Mental Status: He is alert and oriented to person, place, and time.     Motor: No weakness.  Psychiatric:        Mood and Affect: Mood normal.        Behavior: Behavior normal.     BP 137/80   Pulse 76   Ht _0  (1.854 m)   Wt 228 lb 1.6 oz (103.5 kg)   BMI 30.09 kg/m  Wt Readings from Last 3 Encounters:  05/01/22 228 lb 1.6 oz (103.5 kg)  12/14/21 221 lb 14.4 oz (100.7 kg)  10/09/21 220 lb 3.2 oz (99.9 kg)     Health Maintenance Due  Topic Date Due   Medicare Annual Wellness (AWV)  Never done   FOOT EXAM  Never done   HIV Screening  Never done   Diabetic kidney evaluation - Urine ACR  Never done   Zoster Vaccines- Shingrix (1 of 2) Never done   Diabetic kidney evaluation - GFR measurement  01/24/2022    There are no preventive care reminders to display for this patient.  Lab Results  Component Value Date   TSH 2.97 07/20/2020   Lab Results  Component Value Date   WBC 6.3 08/12/2020   HGB 14.4 08/12/2020   HCT 42.3 08/12/2020   MCV 86.2 08/12/2020   PLT 158 08/12/2020   Lab Results  Component Value Date   NA 135 01/24/2021   K 4.0 01/24/2021   CO2 27 01/24/2021   GLUCOSE 395 (H) 01/24/2021   BUN 20 01/24/2021   CREATININE 1.38 (H) 01/24/2021   BILITOT 1.3 (H) 01/24/2021   ALKPHOS 65 08/12/2020   AST 17 01/24/2021   ALT 13 01/24/2021   PROT 6.8 01/24/2021   ALBUMIN 4.2 08/12/2020   CALCIUM 9.1 01/24/2021   ANIONGAP 12 08/12/2020   EGFR 57 (L) 01/24/2021   Lab Results  Component Value Date   CHOL 220 (H) 07/20/2020   Lab Results  Component Value Date   HDL 35 (L) 07/20/2020   Lab Results  Component Value Date   LDLCALC 161 (H) 07/20/2020   Lab Results  Component Value Date   TRIG 122 07/20/2020   Lab Results  Component Value Date   CHOLHDL 6.3 (H) 07/20/2020   Lab Results  Component Value Date   HGBA1C 7.5 05/01/2022      Assessment & Plan:   Problem List Items Addressed This  Visit       Cardiovascular and Mediastinum   Atrial fibrillation ([place])    Stable      Relevant Orders   POCT  INR (Completed)     Endocrine   Diabetes 1.5, 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 - The patient will continue the current treatment regimen.  - I encouraged the patient to regularly check blood sugar.  - I encouraged the patient to monitor diet. I encouraged the patient to eat low-carb and low-sugar to help prevent blood sugar spikes.  - I encouraged the patient to continue following their prescribed treatment plan for diabetes - I informed the patient to get help if blood sugar drops below 22m/dL, or if suddenly have trouble thinking clearly or breathing.  Patient was advised to buy a book on diabetes from a local bookstore or from [place]  Patient should read 2 chapters every day to keep the motivation going, this is in addition to some of the materials we provided them from the office.  There are other resources on the Internet like YouTube and wilkipedia to get an education on the diabetes      Relevant Orders   POCT HgB A1C (Completed)   POCT glucose (manual entry) (Completed)     Other   Obesity (BMI 30-39.9)    - I encouraged the patient to lose weight.  - I educated them on making healthy dietary choices including eating more fruits and vegetables and less fried foods. - I encouraged the patient to exercise more, and educated on the benefits of exercise including weight loss, diabetes prevention, and hypertension prevention.   Dietary counseling with a registered dietician  Referral to a weight management support group (e.g. Weight Watchers, Overeaters Anonymous)  If your BMI is greater than 29 or you have gained more than 15 pounds you should work on weight loss.  Attend a healthy cooking class       Seasonal allergies    Take Claritin 10 mg p.o. daily     Blood pressure is stable  No orders of the defined  types were placed in this encounter.   Follow-up: No follow-ups on file.    JCletis Athens MD

## 2022-06-04 ENCOUNTER — Other Ambulatory Visit: Payer: Self-pay

## 2022-06-04 ENCOUNTER — Other Ambulatory Visit: Payer: Self-pay | Admitting: Internal Medicine

## 2022-06-04 MED ORDER — DAPAGLIFLOZIN PROPANEDIOL 10 MG PO TABS: 10.0000 mg | ORAL_TABLET | Freq: Every day | ORAL | 0 refills | Status: DC

## 2022-06-05 ENCOUNTER — Other Ambulatory Visit: Payer: Self-pay | Admitting: Internal Medicine

## 2022-06-06 ENCOUNTER — Ambulatory Visit (INDEPENDENT_AMBULATORY_CARE_PROVIDER_SITE_OTHER): Payer: PPO | Admitting: Internal Medicine

## 2022-06-06 ENCOUNTER — Encounter: Payer: Self-pay | Admitting: Internal Medicine

## 2022-06-06 VITALS — BP 148/81 | HR 66 | Ht 73.0 in | Wt 228.2 lb

## 2022-06-06 DIAGNOSIS — I4891 Unspecified atrial fibrillation: Secondary | ICD-10-CM | POA: Diagnosis not present

## 2022-06-06 DIAGNOSIS — E139 Other specified diabetes mellitus without complications: Secondary | ICD-10-CM

## 2022-06-06 DIAGNOSIS — J302 Other seasonal allergic rhinitis: Secondary | ICD-10-CM

## 2022-06-06 LAB — POCT INR: INR: 2.3 (ref 2.0–3.0)

## 2022-06-06 NOTE — Progress Notes (Signed)
Established Patient Office Visit  Subjective:  Patient ID: Jesse Humphrey, male    DOB: Oct 21, 1955  Age: 66 y.o. MRN: [id number]  CC:  Chief Complaint  Patient presents with   Coagulation Disorder    HPI  Tito Ausmus presents for check up  Past Medical History:  Diagnosis Date   Atrial fibrillation ([place])    Diabetes mellitus without complication ([place])    Hypertension     History reviewed. No pertinent surgical history.  History reviewed. No pertinent family history.  Social History   Socioeconomic History   Marital status: Single    Spouse name: Not on file   Number of children: Not on file   Years of education: Not on file   Highest education level: Not on file  Occupational History   Not on file  Tobacco Use   Smoking status: Never   Smokeless tobacco: Never  Substance and Sexual Activity   Alcohol use: Never   Drug use: Never   Sexual activity: Not on file  Other Topics Concern   Not on file  Social History Narrative   Not on file   Social Determinants of Health   Financial Resource Strain: Not on file  Food Insecurity: Not on file  Transportation Needs: Not on file  Physical Activity: Not on file  Stress: Not on file  Social Connections: Not on file  Intimate Partner Violence: Not on file     Current Outpatient Medications:    clotrimazole-betamethasone (LOTRISONE) cream, Apply 1 Application topically 2 (two) times daily., Disp: 30 g, Rfl: 0   dapagliflozin propanediol (FARXIGA) 10 MG TABS tablet, Take 1 tablet (10 mg total) by mouth daily before breakfast., Disp: 21 tablet, Rfl: 0   digoxin (LANOXIN) 0.25 MG tablet, Take 1 tablet (250 mcg total) by mouth daily., Disp: 90 tablet, Rfl: 3   doxazosin (CARDURA) 4 MG tablet, TAKE ONE TABLET BY MOUTH DAILY, Disp: 90 tablet, Rfl: 3   glipiZIDE (GLUCOTROL XL) 10 MG 24 hr tablet, Take 1 tablet (10 mg total) by mouth daily., Disp: 90 tablet, Rfl: 3   loratadine (CLARITIN) 10 MG tablet, Take 1 tablet  (10 mg total) by mouth daily., Disp: 30 tablet, Rfl: 1   mupirocin ointment (BACTROBAN) 2 %, Apply 1 application. topically 2 (two) times daily., Disp: 22 g, Rfl: 4   olmesartan (BENICAR) 40 MG tablet, TAKE ONE TABLET BY MOUTH DAILY, Disp: 90 tablet, Rfl: 3   pioglitazone (ACTOS) 45 MG tablet, TAKE ONE TABLET BY MOUTH DAILY, Disp: 90 tablet, Rfl: 3   warfarin (COUMADIN) 5 MG tablet, TAKE ONE TABLET BY MOUTH DAILY, Disp: 90 tablet, Rfl: 2   No Known Allergies  ROS Review of Systems  Constitutional: Negative.   HENT: Negative.    Eyes: Negative.   Respiratory: Negative.    Cardiovascular: Negative.   Gastrointestinal: Negative.   Endocrine: Negative.   Genitourinary: Negative.   Musculoskeletal: Negative.   Skin: Negative.   Allergic/Immunologic: Negative.   Neurological: Negative.   Hematological: Negative.   Psychiatric/Behavioral: Negative.    All other systems reviewed and are negative.     Objective:    Physical Exam Vitals reviewed.  Constitutional:      Appearance: Normal appearance.  HENT:     Mouth/Throat:     Mouth: Mucous membranes are moist.  Eyes:     Pupils: Pupils are equal, round, and reactive to light.  Neck:     Vascular: No carotid bruit.  Cardiovascular:  Rate and Rhythm: Normal rate and regular rhythm.     Pulses: Normal pulses.     Heart sounds: Normal heart sounds.  Pulmonary:     Effort: Pulmonary effort is normal.     Breath sounds: Normal breath sounds.  Abdominal:     General: Bowel sounds are normal.     Palpations: Abdomen is soft. There is no hepatomegaly, splenomegaly or mass.     Tenderness: There is no abdominal tenderness.     Hernia: No hernia is present.  Musculoskeletal:     Cervical back: Neck supple.     Right lower leg: No edema.     Left lower leg: No edema.  Skin:    Findings: No rash.  Neurological:     Mental Status: He is alert and oriented to person, place, and time.     Motor: No weakness.  Psychiatric:         Mood and Affect: Mood normal.        Behavior: Behavior normal.     BP (!) 148/81   Pulse 66   Ht _0  (1.854 m)   Wt 228 lb 3.2 oz (103.5 kg)   SpO2 98%   BMI 30.11 kg/m  Wt Readings from Last 3 Encounters:  06/06/22 228 lb 3.2 oz (103.5 kg)  05/01/22 228 lb 1.6 oz (103.5 kg)  12/14/21 221 lb 14.4 oz (100.7 kg)     Health Maintenance Due  Topic Date Due   Medicare Annual Wellness (AWV)  Never done   FOOT EXAM  Never done   HIV Screening  Never done   Diabetic kidney evaluation - Urine ACR  Never done   DTaP/Tdap/Td (1 - Tdap) Never done   Zoster Vaccines- Shingrix (1 of 2) Never done   Diabetic kidney evaluation - GFR measurement  01/24/2022    There are no preventive care reminders to display for this patient.  Lab Results  Component Value Date   TSH 2.97 07/20/2020   Lab Results  Component Value Date   WBC 6.3 08/12/2020   HGB 14.4 08/12/2020   HCT 42.3 08/12/2020   MCV 86.2 08/12/2020   PLT 158 08/12/2020   Lab Results  Component Value Date   NA 135 01/24/2021   K 4.0 01/24/2021   CO2 27 01/24/2021   GLUCOSE 395 (H) 01/24/2021   BUN 20 01/24/2021   CREATININE 1.38 (H) 01/24/2021   BILITOT 1.3 (H) 01/24/2021   ALKPHOS 65 08/12/2020   AST 17 01/24/2021   ALT 13 01/24/2021   PROT 6.8 01/24/2021   ALBUMIN 4.2 08/12/2020   CALCIUM 9.1 01/24/2021   ANIONGAP 12 08/12/2020   EGFR 57 (L) 01/24/2021   Lab Results  Component Value Date   CHOL 220 (H) 07/20/2020   Lab Results  Component Value Date   HDL 35 (L) 07/20/2020   Lab Results  Component Value Date   LDLCALC 161 (H) 07/20/2020   Lab Results  Component Value Date   TRIG 122 07/20/2020   Lab Results  Component Value Date   CHOLHDL 6.3 (H) 07/20/2020   Lab Results  Component Value Date   HGBA1C 7.5 05/01/2022      Assessment & Plan:   Problem List Items Addressed This Visit       Cardiovascular and Mediastinum   Atrial fibrillation ([place]) - Primary    Pro time is  therapeutic      Relevant Orders   POCT INR (Completed)     Endocrine  Diabetes 1.5, managed as type 2 ([place])    On farxiga        Other   Seasonal allergies    No orders of the defined types were placed in this encounter.   Follow-up: No follow-ups on file.    Cletis Athens, MD

## 2022-06-06 NOTE — Assessment & Plan Note (Signed)
On farxiga

## 2022-06-06 NOTE — Assessment & Plan Note (Signed)
Pro time is therapeutic 

## 2022-06-21 ENCOUNTER — Telehealth: Payer: Self-pay | Admitting: Internal Medicine

## 2022-06-21 NOTE — Telephone Encounter (Signed)
Patient says he is still in the donut hole and is requesting enough samples to get him to the first of the year.   He was given samples  but it was not enough. He is 6 days short to have enough. Please call pt [phone]

## 2022-06-21 NOTE — Telephone Encounter (Signed)
7 days sample given. Lot [id number] exp 11/29/24 10 mg [place]

## 2022-07-03 ENCOUNTER — Encounter: Payer: Self-pay | Admitting: Internal Medicine

## 2022-07-03 ENCOUNTER — Ambulatory Visit (INDEPENDENT_AMBULATORY_CARE_PROVIDER_SITE_OTHER): Payer: PPO | Admitting: Internal Medicine

## 2022-07-03 VITALS — BP 128/84 | HR 77 | Ht 73.0 in | Wt 226.1 lb

## 2022-07-03 DIAGNOSIS — E139 Other specified diabetes mellitus without complications: Secondary | ICD-10-CM

## 2022-07-03 DIAGNOSIS — J302 Other seasonal allergic rhinitis: Secondary | ICD-10-CM

## 2022-07-03 DIAGNOSIS — I4891 Unspecified atrial fibrillation: Secondary | ICD-10-CM | POA: Diagnosis not present

## 2022-07-03 LAB — PROTIME-INR: INR: 3.9 — AB (ref 0.80–1.20)

## 2022-07-03 NOTE — Assessment & Plan Note (Signed)
Take Claritin 5 mg p.o. daily 

## 2022-07-03 NOTE — Assessment & Plan Note (Signed)
Stable at the present time patient pro time is elevated he is taking 2 Coumadin 5 mg on Sundays.  I asked him to  start taking Coumadin 5 mg p.o. every day

## 2022-07-03 NOTE — Assessment & Plan Note (Signed)

## 2022-07-03 NOTE — Progress Notes (Signed)
Established Patient Office Visit  Subjective:  Patient ID: Jesse Humphrey, male    DOB: 1956-04-27  Age: 67 y.o. MRN: [id number]  CC:  Chief Complaint  Patient presents with   Atrial Fibrillation    Atrial Fibrillation Past medical history includes atrial fibrillation.    Jesse Humphrey presents for protime  check  Past Medical History:  Diagnosis Date   Atrial fibrillation ([place])    Diabetes mellitus without complication ([place])    Hypertension     History reviewed. No pertinent surgical history.  History reviewed. No pertinent family history.  Social History   Socioeconomic History   Marital status: Single    Spouse name: Not on file   Number of children: Not on file   Years of education: Not on file   Highest education level: Not on file  Occupational History   Not on file  Tobacco Use   Smoking status: Never   Smokeless tobacco: Never  Substance and Sexual Activity   Alcohol use: Never   Drug use: Never   Sexual activity: Not on file  Other Topics Concern   Not on file  Social History Narrative   Not on file   Social Determinants of Health   Financial Resource Strain: Not on file  Food Insecurity: Not on file  Transportation Needs: Not on file  Physical Activity: Not on file  Stress: Not on file  Social Connections: Not on file  Intimate Partner Violence: Not on file     Current Outpatient Medications:    clotrimazole-betamethasone (LOTRISONE) cream, Apply 1 Application topically 2 (two) times daily., Disp: 30 g, Rfl: 0   dapagliflozin propanediol (FARXIGA) 10 MG TABS tablet, Take 1 tablet (10 mg total) by mouth daily before breakfast., Disp: 21 tablet, Rfl: 0   digoxin (LANOXIN) 0.25 MG tablet, Take 1 tablet (250 mcg total) by mouth daily., Disp: 90 tablet, Rfl: 3   doxazosin (CARDURA) 4 MG tablet, TAKE ONE TABLET BY MOUTH DAILY, Disp: 90 tablet, Rfl: 3   glipiZIDE (GLUCOTROL XL) 10 MG 24 hr tablet, Take 1 tablet (10 mg total) by mouth daily.,  Disp: 90 tablet, Rfl: 3   loratadine (CLARITIN) 10 MG tablet, Take 1 tablet (10 mg total) by mouth daily., Disp: 30 tablet, Rfl: 1   mupirocin ointment (BACTROBAN) 2 %, Apply 1 application. topically 2 (two) times daily., Disp: 22 g, Rfl: 4   olmesartan (BENICAR) 40 MG tablet, TAKE ONE TABLET BY MOUTH DAILY, Disp: 90 tablet, Rfl: 3   pioglitazone (ACTOS) 45 MG tablet, TAKE ONE TABLET BY MOUTH DAILY, Disp: 90 tablet, Rfl: 3   warfarin (COUMADIN) 5 MG tablet, TAKE ONE TABLET BY MOUTH DAILY, Disp: 90 tablet, Rfl: 2   No Known Allergies  ROS Review of Systems  Constitutional: Negative.   HENT: Negative.    Eyes: Negative.   Respiratory: Negative.    Cardiovascular: Negative.   Gastrointestinal: Negative.   Endocrine: Negative.   Genitourinary: Negative.   Musculoskeletal: Negative.   Skin: Negative.   Allergic/Immunologic: Negative.   Neurological: Negative.   Hematological: Negative.   Psychiatric/Behavioral: Negative.    All other systems reviewed and are negative.     Objective:    Physical Exam Vitals reviewed.  Constitutional:      Appearance: Normal appearance.  HENT:     Mouth/Throat:     Mouth: Mucous membranes are moist.  Eyes:     Pupils: Pupils are equal, round, and reactive to light.  Neck:  Vascular: No carotid bruit.  Cardiovascular:     Rate and Rhythm: Normal rate and regular rhythm.     Pulses: Normal pulses.     Heart sounds: Normal heart sounds.  Pulmonary:     Effort: Pulmonary effort is normal.     Breath sounds: Normal breath sounds.  Abdominal:     General: Bowel sounds are normal.     Palpations: Abdomen is soft. There is no hepatomegaly, splenomegaly or mass.     Tenderness: There is no abdominal tenderness.     Hernia: No hernia is present.  Musculoskeletal:     Cervical back: Neck supple.     Right lower leg: No edema.     Left lower leg: No edema.  Skin:    Findings: No rash.  Neurological:     Mental Status: He is alert and  oriented to person, place, and time.     Motor: No weakness.  Psychiatric:        Mood and Affect: Mood normal.        Behavior: Behavior normal.     BP 128/84   Pulse 77   Ht 6' 1" (1.854 m)   Wt 226 lb 1.6 oz (102.6 kg)   SpO2 98%   BMI 29.83 kg/m  Wt Readings from Last 3 Encounters:  07/03/22 226 lb 1.6 oz (102.6 kg)  06/06/22 228 lb 3.2 oz (103.5 kg)  05/01/22 228 lb 1.6 oz (103.5 kg)     Health Maintenance Due  Topic Date Due   Medicare Annual Wellness (AWV)  Never done   FOOT EXAM  Never done   Diabetic kidney evaluation - Urine ACR  Never done   Diabetic kidney evaluation - eGFR measurement  01/24/2022    There are no preventive care reminders to display for this patient.  Lab Results  Component Value Date   TSH 2.97 07/20/2020   Lab Results  Component Value Date   WBC 6.3 08/12/2020   HGB 14.4 08/12/2020   HCT 42.3 08/12/2020   MCV 86.2 08/12/2020   PLT 158 08/12/2020   Lab Results  Component Value Date   NA 135 01/24/2021   K 4.0 01/24/2021   CO2 27 01/24/2021   GLUCOSE 395 (H) 01/24/2021   BUN 20 01/24/2021   CREATININE 1.38 (H) 01/24/2021   BILITOT 1.3 (H) 01/24/2021   ALKPHOS 65 08/12/2020   AST 17 01/24/2021   ALT 13 01/24/2021   PROT 6.8 01/24/2021   ALBUMIN 4.2 08/12/2020   CALCIUM 9.1 01/24/2021   ANIONGAP 12 08/12/2020   EGFR 57 (L) 01/24/2021   Lab Results  Component Value Date   CHOL 220 (H) 07/20/2020   Lab Results  Component Value Date   HDL 35 (L) 07/20/2020   Lab Results  Component Value Date   LDLCALC 161 (H) 07/20/2020   Lab Results  Component Value Date   TRIG 122 07/20/2020   Lab Results  Component Value Date   CHOLHDL 6.3 (H) 07/20/2020   Lab Results  Component Value Date   HGBA1C 7.5 05/01/2022      Assessment & Plan:   Problem List Items Addressed This Visit       Cardiovascular and Mediastinum   Atrial fibrillation (HCC) - Primary    Stable at the present time patient pro time is elevated he  is taking 2 Coumadin 5 mg on Sundays.  I asked him to  start taking Coumadin 5 mg p.o. every day        Relevant Orders   Protime-INR (Completed)     Endocrine   Diabetes 1.5, managed as type 2 ([place])    - The patient's blood sugar is labile on med. - The patient will continue the current treatment regimen.  - I encouraged the patient to regularly check blood sugar.  - I encouraged the patient to monitor diet. I encouraged the patient to eat low-carb and low-sugar to help prevent blood sugar spikes.  - I encouraged the patient to continue following their prescribed treatment plan for diabetes - I informed the patient to get help if blood sugar drops below 82m/dL, or if suddenly have trouble thinking clearly or breathing.  Patient was advised to buy a book on diabetes from a local bookstore or from [place]  Patient should read 2 chapters every day to keep the motivation going, this is in addition to some of the materials we provided them from the office.  There are other resources on the Internet like YouTube and wilkipedia to get an education on the diabetes        Other   Seasonal allergies    Take Claritin 5 mg p.o. daily       No orders of the defined types were placed in this encounter.   Follow-up: No follow-ups on file.    JCletis Athens MD

## 2022-08-11 ENCOUNTER — Other Ambulatory Visit: Payer: Self-pay | Admitting: Cardiology

## 2022-08-15 ENCOUNTER — Other Ambulatory Visit: Payer: Self-pay

## 2022-08-16 ENCOUNTER — Other Ambulatory Visit: Payer: Self-pay

## 2022-08-16 MED ORDER — DIGOXIN 250 MCG PO TABS: 250.0000 ug | ORAL_TABLET | Freq: Every day | ORAL | 3 refills | Status: DC

## 2022-10-29 ENCOUNTER — Other Ambulatory Visit: Payer: Self-pay | Admitting: Cardiovascular Disease

## 2023-01-31 ENCOUNTER — Other Ambulatory Visit: Payer: Self-pay | Admitting: Cardiovascular Disease

## 2023-03-26 ENCOUNTER — Other Ambulatory Visit: Payer: Self-pay | Admitting: Internal Medicine

## 2023-05-03 ENCOUNTER — Other Ambulatory Visit: Payer: Self-pay | Admitting: Cardiovascular Disease

## 2023-06-10 ENCOUNTER — Other Ambulatory Visit: Payer: Self-pay | Admitting: Internal Medicine

## 2023-08-09 ENCOUNTER — Encounter (INDEPENDENT_AMBULATORY_CARE_PROVIDER_SITE_OTHER): Payer: Self-pay | Admitting: Vascular Surgery

## 2023-08-09 ENCOUNTER — Ambulatory Visit (INDEPENDENT_AMBULATORY_CARE_PROVIDER_SITE_OTHER): Payer: PPO | Admitting: Vascular Surgery

## 2023-08-09 VITALS — BP 146/85 | HR 71 | Resp 16 | Wt 217.2 lb

## 2023-08-09 DIAGNOSIS — I872 Venous insufficiency (chronic) (peripheral): Secondary | ICD-10-CM | POA: Diagnosis not present

## 2023-08-09 DIAGNOSIS — I10 Essential (primary) hypertension: Secondary | ICD-10-CM | POA: Diagnosis not present

## 2023-08-09 DIAGNOSIS — E139 Other specified diabetes mellitus without complications: Secondary | ICD-10-CM

## 2023-08-09 DIAGNOSIS — I119 Hypertensive heart disease without heart failure: Secondary | ICD-10-CM

## 2023-08-09 NOTE — Progress Notes (Signed)
 Patient ID: Jesse Humphrey, male   DOB: [birth year], 68 y.o.   MRN: [id number]  Chief Complaint  Patient presents with   New Patient (Initial Visit)    Ref Masoud consult demaitis    HPI Jesse Humphrey is a 68 y.o. male.  I am asked to see the patient by Dr. Britta for evaluation of significant stasis dermatitis changes of the lower extremities predominantly on the right.  This is also associated with some swelling.  He has intermittently got sores on the inner ankle and lower leg area where the stasis dermatitis changes are the most prominent.  He denies any known history of DVT or superficial thrombophlebitis.  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  This left a dent in the sore and then the dark discoloration of the stasis dermatitis changes began following that.  He is also began having stasis dermatitis changes on the left leg although not as prominent as the right.  He has recently gotten compression socks but has not yet started using them.  He has also noticed increasing varicosities particularly in the right calf and lower leg area.  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  He notices the swelling gets worse when he does this.   Past Medical History:  Diagnosis Date   Atrial fibrillation (HCC)    Diabetes mellitus without complication (HCC)    Hypertension     Past Surgical History:  Procedure Laterality Date   NO PAST SURGERIES       Family History  Problem Relation Age of Onset   Hypertension Mother    Diabetes Mother    Stroke Mother    Varicose Veins Mother    Heart attack Mother    Hypertension Father    Neuropathy Father       Social History   Tobacco Use   Smoking status: Never   Smokeless tobacco: Never  Substance Use Topics   Alcohol use: Never   Drug use: Never     No Known Allergies  Current Outpatient Medications  Medication Sig Dispense Refill    clotrimazole -betamethasone  (LOTRISONE ) cream Apply 1 Application topically 2 (two) times daily. 30 g 0   dapagliflozin  propanediol (FARXIGA ) 10 MG TABS tablet Take 1 tablet (10 mg total) by mouth daily before breakfast. 21 tablet 0   digoxin  (LANOXIN ) 0.25 MG tablet Take 1 tablet (250 mcg total) by mouth daily. 90 tablet 3   doxazosin  (CARDURA ) 4 MG tablet TAKE ONE TABLET BY MOUTH DAILY 90 tablet 3   glipiZIDE  (GLUCOTROL  XL) 10 MG 24 hr tablet Take 1 tablet (10 mg total) by mouth daily. 90 tablet 3   loratadine  (CLARITIN ) 10 MG tablet Take 1 tablet (10 mg total) by mouth daily. 30 tablet 1   metoprolol  (TOPROL -XL) 200 MG 24 hr tablet TAKE 1 TABLET BY MOUTH DAILY 90 tablet 0   mupirocin  ointment (BACTROBAN ) 2 % Apply 1 application. topically 2 (two) times daily. 22 g 4   olmesartan  (BENICAR ) 40 MG tablet TAKE ONE TABLET BY MOUTH DAILY 90 tablet 3   pioglitazone  (ACTOS ) 45 MG tablet TAKE ONE TABLET BY MOUTH DAILY 90 tablet 3   warfarin (COUMADIN ) 5 MG tablet TAKE ONE TABLET BY MOUTH DAILY 90 tablet 2   No current facility-administered medications for this visit.      REVIEW OF SYSTEMS (Negative unless checked)  Constitutional: [] Weight loss  [] Fever  [] Chills Cardiac: [] Chest pain   [] Chest pressure   [x] Palpitations   [] Shortness of breath when laying flat   [] Shortness of breath at rest   [] Shortness of breath with exertion. Vascular:  [] Pain in legs with walking   [] Pain in legs at rest   [] Pain in legs when laying flat   [] Claudication   [] Pain in feet when walking  [] Pain in feet at rest  [] Pain in feet when laying flat   [] History of DVT   [] Phlebitis   [x] Swelling in legs   [] Varicose veins   [] Non-healing ulcers Pulmonary:   [] Uses home oxygen   [] Productive cough   [] Hemoptysis   [] Wheeze  [] COPD   [] Asthma Neurologic:  [] Dizziness  [] Blackouts   [] Seizures   [] History of stroke   [] History of TIA  [] Aphasia   [] Temporary blindness   [] Dysphagia   [] Weakness or numbness in arms    [] Weakness or numbness in legs Musculoskeletal:  [x] Arthritis   [] Joint swelling   [] Joint pain   [] Low back pain Hematologic:  [] Easy bruising  [] Easy bleeding   [] Hypercoagulable state   [] Anemic  [] Hepatitis Gastrointestinal:  [] Blood in stool   [] Vomiting blood  [] Gastroesophageal reflux/heartburn   [] Abdominal pain Genitourinary:  [] Chronic kidney disease   [] Difficult urination  [] Frequent urination  [] Burning with urination   [] Hematuria Skin:  [] Rashes   [] Ulcers   [] Wounds Psychological:  [] History of anxiety   []  History of major depression.    Physical Exam BP (!) 146/85   Pulse 71   Resp 16   Wt 217 lb 3.2 oz (98.5 kg)   BMI 28.66 kg/m  Gen:  WD/WN, NAD. Appears younger than stated age. Head: [place]/AT, No temporalis wasting.  Ear/Nose/Throat: Hearing grossly intact, nares w/o erythema or drainage, oropharynx w/o Erythema/Exudate Eyes: Conjunctiva clear, sclera non-icteric  Neck: trachea midline.  No JVD.  Pulmonary:  Good air movement, respirations not labored, no use of accessory muscles  Cardiac: irregular Vascular:  Vessel Right Left  Radial Palpable Palpable                          PT Palpable Palpable  DP Palpable Palpable   Gastrointestinal:. No masses, surgical incisions, or scars. Musculoskeletal: M/S 5/5 throughout.  Extremities without ischemic changes.  No deformity or atrophy.  Prominent varicosities more so on the right than the left particularly in the medial calf and lower leg area.  Prominent stasis dermatitis changes are present in the right lower extremity with mild stasis dermatitis changes present in the left lower extremity.  1+ right lower extremity edema with trace left lower extremity edema. Neurologic: Sensation grossly intact in extremities.  Symmetrical.  Speech is fluent. Motor exam as listed above. Psychiatric: Judgment intact, Mood & affect appropriate for pt's clinical situation. Dermatologic: No rashes or ulcers noted.  No cellulitis  or open wounds.    Radiology No results found.  Labs No results found for this or any previous visit (from the past 2160 hours).  Assessment/Plan:  Chronic venous stasis dermatitis Patient has prominent stasis dermatitis changes with signs of venous insufficiency in both lower extremities.  He should begin wearing his compression socks which are 20 to 30 mmHg on a daily basis.  He should start wearing them first thing in the morning and then take them off in the evening.  This is most important when he is driving.  We discussed the importance of  exercise and elevation of his legs.  We discussed a venous reflux study to be performed at his convenience in the near future.  We described the pathophysiology and natural history of venous disease.  We discussed the importance of continuing to use moisturizers and lotions on his legs to avoid dry cracked skin.  We will see him back following his venous reflux study to discuss the results and determine further treatment options.  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 Also, involved in wound healing. On appropriate medications.   Hypertension blood pressure control important in reducing the progression of atherosclerotic disease. On appropriate oral medications.      Selinda Gu 08/09/2023, 1:57 PM   This note was created with Dragon medical transcription system.  Any errors from dictation are unintentional.

## 2023-08-09 NOTE — Patient Instructions (Signed)
Stasis Dermatitis Stasis dermatitis is a long-term (chronic) skin condition that happens when you have poor circulation. This is when your veins can no longer pump blood back to the heart like they should. This condition causes a red or brown scaly rash or sores (ulcers). It often affects the lower legs. It may affect one leg or both legs. Without treatment, this condition can lead to other skin problems. What are the causes? This condition is caused by poor circulation. What increases the risk? You are more likely to get this condition if: You are not very active. You have to stand for long periods. You have varicose veins. These are veins that are enlarged and twisted. You have venous insufficiency. This is when your leg veins struggle to send blood back to your heart. You have been pregnant many times. You have had vein surgery or injuries to your legs in the past. You have heart or kidney failure. You have had a blood clot. You are 31 years of age or older and are overweight. What are the signs or symptoms? Common early symptoms of this condition include: Itching in one or both of your legs. Swelling in your ankle or leg. This may get better at night but be worse during the day. Skin that looks thin on your ankle and leg. Skin that is dry, cracked, or easily irritated. Red or brown marks that form slowly. Red, swollen skin that burns or is sore. An achy or heavy feeling after you walk or stand for a long time. Pain. Later and more severe symptoms of this condition include: Skin that looks shiny. Ulcers. These are often red or purple and leak fluid. Skin that feels hard. Severe itching. A change in the shape or color of your lower legs. Severe pain. Trouble walking. How is this diagnosed? This condition may be diagnosed based on your symptoms, medical history, and a physical exam. You may also have tests. These may include: Blood tests. Doppler ultrasound. This can check blood  flow. Allergy tests. You may need to see a health care provider who is an expert in skin diseases (dermatologist). How is this treated? This condition may be treated with: Compression stockings or an elastic wrap. Medicines, such as: Corticosteroid creams and ointments. Other medicines applied to the skin. Medicines to reduce swelling in the legs (diuretics). Antibiotics. Medicines to help with itching (antihistamines). A bandage (dressing). A wrap that has zinc and gelatin on it (Unna boot). Follow these instructions at home: Medicines Take or use over-the-counter and prescription medicines only as told by your provider. If you were prescribed antibiotics, take or use them as told by your provider. Do not stop taking or using the antibiotic even if you start to feel better. Skin care Moisturize your skin as told by your provider. Do not use scented moisturizers, soaps, detergents, or perfumes. These can irritate your skin. Apply a cool, wet cloth (cool compress) to the affected areas. Do not scratch your skin. Do not rub your skin dry after a bath or shower. Gently pat your skin dry. Check the affected areas every day for signs of infection. Check for: More redness, swelling, or pain. More fluid or blood. Warmth. Pus or a bad smell. Activity Walk as told by your provider. Walking increases blood flow. Do calf and ankle exercises during the day as told by your provider. This will help increase blood flow. Raise (elevate) your legs above the level of your heart while you are sitting or lying down.  Lifestyle Work with your provider to lose weight, if needed. Do not cross your legs when you sit. Do not stand or sit in one position for a long time. Wear comfortable, loose-fitting clothes. Circulation in your legs will be worse if you wear tight pants, belts, and waistbands. Do not use any products that contain nicotine or tobacco. These products include cigarettes, chewing tobacco,  and vaping devices, such as e-cigarettes. If you need help quitting, ask your provider. General instructions Follow instructions from your provider on how to remove and change any dressings. Wear compression stockings as told by your provider. These stockings help to prevent blood clots and reduce swelling in your legs. If you were told to wear an Radio broadcast assistant, do so as told by your provider. Keep all follow-up visits. Your provider will check your skin for any changes and adjust your treatment plan as needed. Contact a health care provider if: Your condition does not get better with treatment. Your condition gets worse. You have any signs of infection. You see red streaks coming from the affected area. The affected area turns darker. You have a fever. Get help right away if: Your bone or joint under the affected area becomes painful after the skin has healed. You feel a deep pain in your leg or groin. You are short of breath. These symptoms may be an emergency. Get help right away. Call 911. Do not wait to see if the symptoms will go away. Do not drive yourself to the hospital. This information is not intended to replace advice given to you by your health care provider. Make sure you discuss any questions you have with your health care provider. Document Revised: 07/03/2022 Document Reviewed: 07/03/2022 Elsevier Patient Education  2024 [employer].

## 2023-08-09 NOTE — Assessment & Plan Note (Signed)
 blood pressure control important in reducing the progression of atherosclerotic disease. On appropriate oral medications.

## 2023-08-09 NOTE — Assessment & Plan Note (Signed)
 Patient has prominent stasis dermatitis changes with signs of venous insufficiency in both lower extremities.  He should begin wearing his compression socks which are 20 to 30 mmHg on a daily basis.  He should start wearing them first thing in the morning and then take them off in the evening.  This is most important when he is driving.  We discussed the importance of exercise and elevation of his legs.  We discussed a venous reflux study to be performed at his convenience in the near future.  We described the pathophysiology and natural history of venous disease.  We discussed the importance of continuing to use moisturizers and lotions on his legs to avoid dry cracked skin.  We will see him back following his venous reflux study to discuss the results and determine further treatment options.

## 2023-08-09 NOTE — Assessment & Plan Note (Signed)
 blood glucose control important in reducing the progression of atherosclerotic disease. Also, involved in wound healing. On appropriate medications.

## 2023-08-21 ENCOUNTER — Other Ambulatory Visit: Payer: Self-pay | Admitting: Cardiovascular Disease

## 2023-08-30 ENCOUNTER — Ambulatory Visit (INDEPENDENT_AMBULATORY_CARE_PROVIDER_SITE_OTHER): Payer: PPO

## 2023-08-30 DIAGNOSIS — I872 Venous insufficiency (chronic) (peripheral): Secondary | ICD-10-CM

## 2023-09-03 ENCOUNTER — Encounter (INDEPENDENT_AMBULATORY_CARE_PROVIDER_SITE_OTHER): Payer: Self-pay | Admitting: Vascular Surgery

## 2023-09-03 ENCOUNTER — Ambulatory Visit (INDEPENDENT_AMBULATORY_CARE_PROVIDER_SITE_OTHER): Payer: PPO | Admitting: Vascular Surgery

## 2023-09-03 VITALS — BP 165/96 | HR 66 | Resp 16 | Wt 223.0 lb

## 2023-09-03 DIAGNOSIS — E139 Other specified diabetes mellitus without complications: Secondary | ICD-10-CM | POA: Diagnosis not present

## 2023-09-03 DIAGNOSIS — I10 Essential (primary) hypertension: Secondary | ICD-10-CM | POA: Diagnosis not present

## 2023-09-03 DIAGNOSIS — I83893 Varicose veins of bilateral lower extremities with other complications: Secondary | ICD-10-CM

## 2023-09-03 DIAGNOSIS — I872 Venous insufficiency (chronic) (peripheral): Secondary | ICD-10-CM

## 2023-09-03 NOTE — Assessment & Plan Note (Signed)
 blood pressure control important in reducing the progression of atherosclerotic disease. On appropriate oral medications.

## 2023-09-03 NOTE — Assessment & Plan Note (Signed)
 Stable and due to significant venous disease.

## 2023-09-03 NOTE — Assessment & Plan Note (Signed)
 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 No DVT or SVT was present.  The patient has had good results from using his compression socks and elevating his legs.  He really is not having much pain at current.  The swelling is mild and intermittent.  The chronic stasis dermatitis changes are similar.  At this point, I offered him continued conservative therapy versus consideration for laser ablation of both great saphenous veins in a staged fashion.  He is not interested in proceeding with any intervention at current, and as long as his symptoms are improved I think that is very reasonable.  He will contact our office if he has worsening symptoms.  Otherwise I will plan to see him back in about 6 months.

## 2023-09-03 NOTE — Progress Notes (Signed)
 MRN : [id number]  Jesse Humphrey is a 68 y.o. (Oct 11, 1955) male who presents with chief complaint of  Chief Complaint  Patient presents with   Follow-up    Pt kconv ble reflux follow up  .  History of Present Illness: Patient returns today in follow up of his venous disease.  He has been wearing his compression socks and elevating his legs, and this has resulted in improvement in his swelling in the lower legs.  He has intermittent pain but this is largely more to neurogenic and arthritic issues he believes.  No new ulceration or infection.  His chronic venous stasis changes are stable bilaterally. 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 No DVT or SVT was present.    Current Outpatient Medications  Medication Sig Dispense Refill   clotrimazole-betamethasone (LOTRISONE) cream Apply 1 Application topically 2 (two) times daily. 30 g 0   dapagliflozin propanediol (FARXIGA) 10 MG TABS tablet Take 1 tablet (10 mg total) by mouth daily before breakfast. 21 tablet 0   digoxin (LANOXIN) 0.25 MG tablet Take 1 tablet (250 mcg total) by mouth daily. 90 tablet 3   doxazosin (CARDURA) 4 MG tablet TAKE ONE TABLET BY MOUTH DAILY 90 tablet 3   glipiZIDE (GLUCOTROL XL) 10 MG 24 hr tablet Take 1 tablet (10 mg total) by mouth daily. 90 tablet 3   loratadine (CLARITIN) 10 MG tablet Take 1 tablet (10 mg total) by mouth daily. 30 tablet 1   metoprolol (TOPROL-XL) 200 MG 24 hr tablet TAKE 1 TABLET BY MOUTH DAILY 90 tablet 0   mupirocin ointment (BACTROBAN) 2 % Apply 1 application. topically 2 (two) times daily. 22 g 4   olmesartan (BENICAR) 40 MG tablet TAKE ONE TABLET BY MOUTH DAILY 90 tablet 3   pioglitazone (ACTOS) 45 MG tablet TAKE ONE TABLET BY MOUTH DAILY 90 tablet 3   warfarin (COUMADIN) 5 MG tablet TAKE ONE TABLET BY MOUTH DAILY 90 tablet 2   No current facility-administered medications for this visit.    Past Medical History:  Diagnosis Date    Atrial fibrillation (HCC)    Diabetes mellitus without complication (HCC)    Hypertension     Past Surgical History:  Procedure Laterality Date   NO PAST SURGERIES       Social History   Tobacco Use   Smoking status: Never   Smokeless tobacco: Never  Substance Use Topics   Alcohol use: Never   Drug use: Never      Family History  Problem Relation Age of Onset   Hypertension Mother    Diabetes Mother    Stroke Mother    Varicose Veins Mother    Heart attack Mother    Hypertension Father    Neuropathy Father      No Known Allergies    REVIEW OF SYSTEMS (Negative unless checked)   Constitutional: [] Weight loss  [] Fever  [] Chills Cardiac: [] Chest pain   [] Chest pressure   [x] Palpitations   [] Shortness of breath when laying flat   [] Shortness of breath at rest   [] Shortness of breath with exertion. Vascular:  [] Pain in legs with walking   [] Pain in legs at rest   [] Pain in legs when laying flat   [] Claudication   [] Pain in feet when walking  [] Pain in feet at rest  [] Pain in feet when laying flat   [] History of DVT   [] Phlebitis   [x] Swelling in legs   [x] Varicose veins   []   Non-healing ulcers Pulmonary:   [] Uses home oxygen   [] Productive cough   [] Hemoptysis   [] Wheeze  [] COPD   [] Asthma Neurologic:  [] Dizziness  [] Blackouts   [] Seizures   [] History of stroke   [] History of TIA  [] Aphasia   [] Temporary blindness   [] Dysphagia   [] Weakness or numbness in arms   [] Weakness or numbness in legs Musculoskeletal:  [x] Arthritis   [] Joint swelling   [] Joint pain   [] Low back pain Hematologic:  [] Easy bruising  [] Easy bleeding   [] Hypercoagulable state   [] Anemic  [] Hepatitis Gastrointestinal:  [] Blood in stool   [] Vomiting blood  [] Gastroesophageal reflux/heartburn   [] Abdominal pain Genitourinary:  [] Chronic kidney disease   [] Difficult urination  [] Frequent urination  [] Burning with urination   [] Hematuria Skin:  [] Rashes   [] Ulcers   [] Wounds Psychological:  [] History of  anxiety   []  History of major depression.  Physical Examination  BP (!) 165/96   Pulse 66   Resp 16   Wt 223 lb (101.2 kg)   BMI 29.42 kg/m  Gen:  WD/WN, NAD Head: [place]/AT, No temporalis wasting. Ear/Nose/Throat: Hearing grossly intact, nares w/o erythema or drainage Eyes: Conjunctiva clear. Sclera non-icteric Neck: Supple.  Trachea midline Pulmonary:  Good air movement, no use of accessory muscles.  Cardiac: RRR, no JVD Vascular:  Vessel Right Left  Radial Palpable Palpable                          PT Palpable Palpable  DP Palpable Palpable   Gastrointestinal: soft, non-tender/non-distended. No guarding/reflex.  Musculoskeletal: M/S 5/5 throughout.  No deformity or atrophy.  Prominent stasis dermatitis changes are present on the right and more mild changes are present on the left.  Varicosities are also a little more prominent on the right than the left.  Trace bilateral lower extremity edema. Neurologic: Sensation grossly intact in extremities.  Symmetrical.  Speech is fluent.  Psychiatric: Judgment intact, Mood & affect appropriate for pt's clinical situation. Dermatologic: No rashes or ulcers noted.  No cellulitis or open wounds.      Labs No results found for this or any previous visit (from the past 2160 hours).  Radiology VAS [place] LOWER EXTREMITY VENOUS REFLUX Result Date: 09/03/2023  Lower Venous Reflux Study Patient Name:  Jesse Humphrey  Date of Exam:   08/30/2023 Medical Rec #: [id number]       Accession #:    [id number] Date of Birth: 1955/11/23       Patient Gender: M Patient Age:   31 years Exam Location:  [facility] Procedure:      VAS [place] LOWER EXTREMITY VENOUS REFLUX Referring Phys: Festus Barren --------------------------------------------------------------------------------  Indications: Swelling, Pain, Edema, and varicosities.  Performing Technologist: Hardie Lora RVT  Examination Guidelines: A complete evaluation includes B-mode imaging, spectral  Doppler, color Doppler, and power Doppler as needed of all accessible portions of each vessel. Bilateral testing is considered an integral part of a complete examination. Limited examinations for reoccurring indications may be performed as noted. The reflux portion of the exam is performed with the patient in reverse Trendelenburg. Significant venous reflux is defined as >500 ms in the superficial venous system, and >1 second in the deep venous system.  Venous Reflux Times +--------------+---------+------+-----------+------------+--------+ RIGHT         Reflux NoRefluxReflux TimeDiameter cmsComments  Yes                                  +--------------+---------+------+-----------+------------+--------+ CFV                     yes   >1 second                      +--------------+---------+------+-----------+------------+--------+ FV prox       no                                             +--------------+---------+------+-----------+------------+--------+ FV mid        no                                             +--------------+---------+------+-----------+------------+--------+ FV dist       no                                             +--------------+---------+------+-----------+------------+--------+ Popliteal     no                                             +--------------+---------+------+-----------+------------+--------+ GSV at SFJ              yes    >500 ms      0.54             +--------------+---------+------+-----------+------------+--------+ GSV prox thigh          yes    >500 ms      0.47             +--------------+---------+------+-----------+------------+--------+ GSV mid thigh           yes    >500 ms      0.51             +--------------+---------+------+-----------+------------+--------+ GSV dist thigh          yes    >500 ms      0.42              +--------------+---------+------+-----------+------------+--------+ GSV at knee             yes    >500 ms      0.46             +--------------+---------+------+-----------+------------+--------+ GSV prox calf           yes    >500 ms      0.49             +--------------+---------+------+-----------+------------+--------+ SSV Pop Fossa no                            0.28             +--------------+---------+------+-----------+------------+--------+ SSV prox calf no  0.18             +--------------+---------+------+-----------+------------+--------+ SSV mid calf  no                            0.14             +--------------+---------+------+-----------+------------+--------+  +--------------+---------+------+-----------+------------+--------+ LEFT          Reflux NoRefluxReflux TimeDiameter cmsComments                         Yes                                  +--------------+---------+------+-----------+------------+--------+ CFV           no                                             +--------------+---------+------+-----------+------------+--------+ FV mid        no                                             +--------------+---------+------+-----------+------------+--------+ FV dist       no                                             +--------------+---------+------+-----------+------------+--------+ Popliteal     no                                             +--------------+---------+------+-----------+------------+--------+ GSV at [facility]    no                            0.64             +--------------+---------+------+-----------+------------+--------+ GSV prox thigh          yes    >500 ms      0.57             +--------------+---------+------+-----------+------------+--------+ GSV mid thigh           yes    >500 ms      0.39              +--------------+---------+------+-----------+------------+--------+ GSV dist thigh          yes    >500 ms      0.36             +--------------+---------+------+-----------+------------+--------+ GSV at knee             yes    >500 ms      0.36             +--------------+---------+------+-----------+------------+--------+ GSV prox calf                               0.36             +--------------+---------+------+-----------+------------+--------+  SSV Pop Fossa no                            0.11             +--------------+---------+------+-----------+------------+--------+ SSV prox calf no                            0.12             +--------------+---------+------+-----------+------------+--------+ SSV mid calf  no                            0.26             +--------------+---------+------+-----------+------------+--------+   Summary: Right: - No evidence of deep vein thrombosis seen in the right lower extremity, from the common femoral through the popliteal veins. - No evidence of superficial venous thrombosis in the right lower extremity. - No evidence of superficial venous reflux seen in the right short saphenous vein. - Venous reflux is noted in the right common femoral vein. - Venous reflux is noted in the right sapheno-femoral junction. - Venous reflux is noted in the right greater saphenous vein in the thigh. - Venous reflux is noted in the right greater saphenous vein in the calf.  Left: - No evidence of deep vein thrombosis seen in the left lower extremity, from the common femoral through the popliteal veins. - No evidence of superficial venous thrombosis in the left lower extremity. - No evidence of superficial venous reflux seen in the left short saphenous vein. - Venous reflux is noted in the left greater saphenous vein in the thigh. - Venous reflux is noted in the left greater saphenous vein in the calf.  *See table(s) above for measurements and  observations. Electronically signed by Festus Barren MD on 09/03/2023 at 8:05:33 AM.    Final     Assessment/Plan  Hypertension blood pressure control important in reducing the progression of atherosclerotic disease. On appropriate oral medications.   Chronic venous stasis dermatitis Stable and due to significant venous disease.  Varicose veins of bilateral lower extremities with other complications Recent venous reflux study demonstrated long segment great saphenous vein reflux bilaterally. No DVT or SVT was present.  The patient has had good results from using his compression socks and elevating his legs.  He really is not having much pain at current.  The swelling is mild and intermittent.  The chronic stasis dermatitis changes are similar.  At this point, I offered him continued conservative therapy versus consideration for laser ablation of both great saphenous veins in a staged fashion.  He is not interested in proceeding with any intervention at current, and as long as his symptoms are improved I think that is very reasonable.  He will contact our office if he has worsening symptoms.  Otherwise I will plan to see him back in about 6 months.  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 Also, involved in wound healing. On appropriate medications.     Hypertension blood pressure control important in reducing the progression of atherosclerotic disease. On appropriate oral medications.  Festus Barren, MD  09/03/2023 12:10 PM    This note was created with Dragon medical transcription system.  Any errors from dictation are purely unintentional

## 2023-09-10 DIAGNOSIS — M9905 Segmental and somatic dysfunction of pelvic region: Secondary | ICD-10-CM | POA: Diagnosis not present

## 2023-09-10 DIAGNOSIS — M62830 Muscle spasm of back: Secondary | ICD-10-CM | POA: Diagnosis not present

## 2023-09-10 DIAGNOSIS — M9903 Segmental and somatic dysfunction of lumbar region: Secondary | ICD-10-CM | POA: Diagnosis not present

## 2023-09-10 DIAGNOSIS — M5416 Radiculopathy, lumbar region: Secondary | ICD-10-CM | POA: Diagnosis not present

## 2023-10-09 DIAGNOSIS — M9903 Segmental and somatic dysfunction of lumbar region: Secondary | ICD-10-CM | POA: Diagnosis not present

## 2023-10-09 DIAGNOSIS — M9905 Segmental and somatic dysfunction of pelvic region: Secondary | ICD-10-CM | POA: Diagnosis not present

## 2023-10-09 DIAGNOSIS — M62830 Muscle spasm of back: Secondary | ICD-10-CM | POA: Diagnosis not present

## 2023-10-09 DIAGNOSIS — M5416 Radiculopathy, lumbar region: Secondary | ICD-10-CM | POA: Diagnosis not present

## 2023-10-15 ENCOUNTER — Encounter: Payer: Self-pay | Admitting: Cardiology

## 2023-10-15 ENCOUNTER — Ambulatory Visit (INDEPENDENT_AMBULATORY_CARE_PROVIDER_SITE_OTHER): Admitting: Cardiology

## 2023-10-15 VITALS — BP 138/78 | HR 87 | Ht 73.0 in | Wt 215.0 lb

## 2023-10-15 DIAGNOSIS — E119 Type 2 diabetes mellitus without complications: Secondary | ICD-10-CM

## 2023-10-15 DIAGNOSIS — I10 Essential (primary) hypertension: Secondary | ICD-10-CM | POA: Diagnosis not present

## 2023-10-15 DIAGNOSIS — I4891 Unspecified atrial fibrillation: Secondary | ICD-10-CM

## 2023-10-15 MED ORDER — METOPROLOL SUCCINATE ER 200 MG PO TB24: 200.0000 mg | ORAL_TABLET | Freq: Every day | ORAL | 1 refills | Status: DC

## 2023-10-15 MED ORDER — GLIPIZIDE ER 10 MG PO TB24
10.0000 mg | ORAL_TABLET | Freq: Every day | ORAL | 1 refills | Status: DC
Start: 2023-10-15 — End: 2024-04-08

## 2023-10-15 MED ORDER — WARFARIN SODIUM 5 MG PO TABS: 5.0000 mg | ORAL_TABLET | Freq: Every day | ORAL | 2 refills | Status: DC

## 2023-10-15 MED ORDER — DOXAZOSIN MESYLATE 4 MG PO TABS: 4.0000 mg | ORAL_TABLET | Freq: Every day | ORAL | 1 refills | Status: DC

## 2023-10-15 NOTE — Progress Notes (Signed)
 Established Patient Office Visit  Subjective:  Patient ID: Jesse Humphrey, male    DOB: 10-Jan-1956  Age: 68 y.o. MRN: [id number]  Chief Complaint  Patient presents with   New Patient (Initial Visit)    New Patient    Patient in office to establish care. Patient doing well, no complaints today. Previous PCP retired, needs medication refills.  Patient needing referral for cardiology. Referral placed.  Patient due for fasting lab work. Will return when fasting.     No other concerns at this time.   Past Medical History:  Diagnosis Date   Atrial fibrillation (HCC)    Diabetes mellitus without complication (HCC)    Hypertension    Obesity (BMI 30-39.9) 02/10/2020    Past Surgical History:  Procedure Laterality Date   NO PAST SURGERIES      Social History   Socioeconomic History   Marital status: Single    Spouse name: Not on file   Number of children: Not on file   Years of education: Not on file   Highest education level: Not on file  Occupational History   Not on file  Tobacco Use   Smoking status: Never   Smokeless tobacco: Never  Substance and Sexual Activity   Alcohol use: Never   Drug use: Never   Sexual activity: Not on file  Other Topics Concern   Not on file  Social History Narrative   Not on file   Social Drivers of Health   Financial Resource Strain: Not on file  Food Insecurity: Not on file  Transportation Needs: Not on file  Physical Activity: Not on file  Stress: Not on file  Social Connections: Not on file  Intimate Partner Violence: Not on file    Family History  Problem Relation Age of Onset   Hypertension Mother    Diabetes Mother    Stroke Mother    Varicose Veins Mother    Heart attack Mother    Hypertension Father    Neuropathy Father     Allergies  Allergen Reactions   Poison [place] Extract    Poison [place] Extract     Outpatient Medications Prior to Visit  Medication Sig   digoxin (LANOXIN) 0.25 MG tablet Take 1 tablet  (250 mcg total) by mouth daily.   loratadine (CLARITIN) 10 MG tablet Take 1 tablet (10 mg total) by mouth daily.   olmesartan (BENICAR) 40 MG tablet TAKE ONE TABLET BY MOUTH DAILY   pioglitazone (ACTOS) 45 MG tablet TAKE ONE TABLET BY MOUTH DAILY   [DISCONTINUED] doxazosin (CARDURA) 4 MG tablet TAKE ONE TABLET BY MOUTH DAILY   [DISCONTINUED] glipiZIDE (GLUCOTROL XL) 10 MG 24 hr tablet Take 1 tablet (10 mg total) by mouth daily.   [DISCONTINUED] metoprolol (TOPROL-XL) 200 MG 24 hr tablet TAKE 1 TABLET BY MOUTH DAILY   [DISCONTINUED] warfarin (COUMADIN) 5 MG tablet TAKE ONE TABLET BY MOUTH DAILY   dapagliflozin propanediol (FARXIGA) 10 MG TABS tablet Take 1 tablet (10 mg total) by mouth daily before breakfast.   [DISCONTINUED] clotrimazole-betamethasone (LOTRISONE) cream Apply 1 Application topically 2 (two) times daily. (Patient not taking: Reported on 10/15/2023)   [DISCONTINUED] mupirocin ointment (BACTROBAN) 2 % Apply 1 application. topically 2 (two) times daily.   No facility-administered medications prior to visit.    Review of Systems  Constitutional: Negative.   HENT: Negative.    Eyes: Negative.   Respiratory: Negative.  Negative for shortness of breath.   Cardiovascular: Negative.  Negative for chest pain.  Gastrointestinal: Negative.  Negative for abdominal pain, constipation and diarrhea.  Genitourinary: Negative.   Musculoskeletal:  Negative for joint pain and myalgias.  Skin: Negative.   Neurological: Negative.  Negative for dizziness and headaches.  Endo/Heme/Allergies: Negative.   All other systems reviewed and are negative.      Objective:   BP 138/78 (BP Location: Right Arm, Patient Position: Sitting, Cuff Size: Large)   Pulse 87   Ht 6\' 1"  (1.854 m)   Wt 215 lb (97.5 kg)   SpO2 97%   BMI 28.37 kg/m   Vitals:   10/15/23 1105 10/15/23 1131  BP: (!) 168/68 138/78  Pulse: 87   Height: 6\' 1"  (1.854 m)   Weight: 215 lb (97.5 kg)   SpO2: 97%   BMI  (Calculated): 28.37     Physical Exam Nursing note reviewed.  Constitutional:      Appearance: Normal appearance. He is normal weight.  HENT:     Head: Normocephalic and atraumatic.     Nose: Nose normal.     Mouth/Throat:     Mouth: Mucous membranes are moist.     Pharynx: Oropharynx is clear.  Eyes:     Extraocular Movements: Extraocular movements intact.     Conjunctiva/sclera: Conjunctivae normal.     Pupils: Pupils are equal, round, and reactive to light.  Cardiovascular:     Rate and Rhythm: Normal rate. Rhythm irregular.     Pulses: Normal pulses.     Heart sounds: Normal heart sounds.  Pulmonary:     Effort: Pulmonary effort is normal.     Breath sounds: Normal breath sounds.  Abdominal:     General: Abdomen is flat. Bowel sounds are normal.     Palpations: Abdomen is soft.  Musculoskeletal:        General: Normal range of motion.     Cervical back: Normal range of motion.  Skin:    General: Skin is warm and dry.  Neurological:     General: No focal deficit present.     Mental Status: He is alert and oriented to person, place, and time.  Psychiatric:        Mood and Affect: Mood normal.        Behavior: Behavior normal.        Thought Content: Thought content normal.        Judgment: Judgment normal.       10/15/2023   11:12 AM  GAD 7 : Generalized Anxiety Score  Nervous, Anxious, on Edge 0  Control/stop worrying 0  Worry too much - different things 0  Trouble relaxing 0  Restless 0  Easily annoyed or irritable 0  Afraid - awful might happen 0  Total GAD 7 Score 0    Flowsheet Row Office Visit from 10/15/2023 in [facility]  PHQ-9 Total Score 0        No results found for any visits on 10/15/23.  No results found for this or any previous visit (from the past 2160 hours).    Assessment & Plan:  Cardiology referral placed Return for fasting lab work  Problem List Items Addressed This Visit       Cardiovascular and  Mediastinum   Atrial fibrillation (HCC)   Relevant Medications   warfarin (COUMADIN) 5 MG tablet   doxazosin (CARDURA) 4 MG tablet   metoprolol (TOPROL-XL) 200 MG 24 hr tablet   Other Relevant Orders   Ambulatory referral to Cardiology   Hypertension - Primary  Relevant Medications   warfarin (COUMADIN) 5 MG tablet   doxazosin (CARDURA) 4 MG tablet   metoprolol (TOPROL-XL) 200 MG 24 hr tablet     Endocrine   Type 2 diabetes mellitus without complication (HCC)   Relevant Medications   glipiZIDE (GLUCOTROL XL) 10 MG 24 hr tablet    Return in about 6 months (around 04/15/2024).   Total time spent: 25 minutes  [employer], NP  10/15/2023   This document may have been prepared by Dragon Voice Recognition software and as such may include unintentional dictation errors.

## 2023-10-21 ENCOUNTER — Other Ambulatory Visit: Payer: Self-pay

## 2023-10-21 ENCOUNTER — Ambulatory Visit: Admitting: Cardiovascular Disease

## 2023-10-21 ENCOUNTER — Encounter: Payer: Self-pay | Admitting: Cardiovascular Disease

## 2023-10-21 VITALS — BP 140/80 | HR 86 | Ht 73.0 in | Wt 221.0 lb

## 2023-10-21 DIAGNOSIS — I119 Hypertensive heart disease without heart failure: Secondary | ICD-10-CM

## 2023-10-21 DIAGNOSIS — I83893 Varicose veins of bilateral lower extremities with other complications: Secondary | ICD-10-CM | POA: Diagnosis not present

## 2023-10-21 DIAGNOSIS — R0602 Shortness of breath: Secondary | ICD-10-CM | POA: Diagnosis not present

## 2023-10-21 DIAGNOSIS — E669 Obesity, unspecified: Secondary | ICD-10-CM

## 2023-10-21 DIAGNOSIS — I10 Essential (primary) hypertension: Secondary | ICD-10-CM | POA: Diagnosis not present

## 2023-10-21 DIAGNOSIS — E139 Other specified diabetes mellitus without complications: Secondary | ICD-10-CM | POA: Diagnosis not present

## 2023-10-21 DIAGNOSIS — I4891 Unspecified atrial fibrillation: Secondary | ICD-10-CM

## 2023-10-21 NOTE — Progress Notes (Signed)
 Cardiology Office Note   Date:  10/21/2023   ID:  Jesse Humphrey, DOB 1955/11/17, MRN [id number]  PCP:  Alica Antu, NP  Cardiologist:  Debborah Fairly, MD      History of Present Illness: Jesse Humphrey is a 68 y.o. male who presents for  Chief Complaint  Patient presents with   Follow-up    consult    [id number] came for evaluation as Dr. Loetta Ringer retired and needs f/u. Has Feeling of tiredness, unable to do exertion.      Past Medical History:  Diagnosis Date   Atrial fibrillation (HCC)    Diabetes mellitus without complication (HCC)    Hypertension    Obesity (BMI 30-39.9) 02/10/2020     Past Surgical History:  Procedure Laterality Date   NO PAST SURGERIES       Current Outpatient Medications  Medication Sig Dispense Refill   dapagliflozin  propanediol (FARXIGA ) 10 MG TABS tablet Take 1 tablet (10 mg total) by mouth daily before breakfast. 21 tablet 0   digoxin  (LANOXIN ) 0.25 MG tablet Take 1 tablet (250 mcg total) by mouth daily. 90 tablet 3   doxazosin  (CARDURA ) 4 MG tablet Take 1 tablet (4 mg total) by mouth daily. 90 tablet 1   glipiZIDE  (GLUCOTROL  XL) 10 MG 24 hr tablet Take 1 tablet (10 mg total) by mouth daily. 90 tablet 1   loratadine  (CLARITIN ) 10 MG tablet Take 1 tablet (10 mg total) by mouth daily. 30 tablet 1   metoprolol  (TOPROL -XL) 200 MG 24 hr tablet Take 1 tablet (200 mg total) by mouth daily. 90 tablet 1   olmesartan  (BENICAR ) 40 MG tablet TAKE ONE TABLET BY MOUTH DAILY 90 tablet 3   pioglitazone  (ACTOS ) 45 MG tablet TAKE ONE TABLET BY MOUTH DAILY 90 tablet 3   warfarin (COUMADIN ) 5 MG tablet Take 1 tablet (5 mg total) by mouth daily. 90 tablet 2   No current facility-administered medications for this visit.    Allergies:   Poison ivy extract and Poison oak extract    Social History:   reports that he has never smoked. He has never used smokeless tobacco. He reports that he does not drink alcohol and does not use drugs.   Family History:   family history includes Diabetes in his mother; Heart attack in his mother; Hypertension in his father and mother; Neuropathy in his father; Stroke in his mother; Varicose Veins in his mother.    ROS:     Review of Systems  Constitutional: Negative.   HENT: Negative.    Eyes: Negative.   Respiratory: Negative.    Gastrointestinal: Negative.   Genitourinary: Negative.   Musculoskeletal: Negative.   Skin: Negative.   Neurological: Negative.   Endo/Heme/Allergies: Negative.   Psychiatric/Behavioral: Negative.    All other systems reviewed and are negative.     All other systems are reviewed and negative.    PHYSICAL EXAM: VS:  BP (!) 140/80   Pulse 86   Ht 6\' 1"  (1.854 m)   Wt 221 lb (100.2 kg)   SpO2 98%   BMI 29.16 kg/m  , BMI Body mass index is 29.16 kg/m. Last weight:  Wt Readings from Last 3 Encounters:  10/21/23 221 lb (100.2 kg)  10/15/23 215 lb (97.5 kg)  09/03/23 223 lb (101.2 kg)     Physical Exam Vitals reviewed.  Constitutional:      Appearance: Normal appearance. He is normal weight.  HENT:     Head: Normocephalic.  Nose: Nose normal.     Mouth/Throat:     Mouth: Mucous membranes are moist.  Eyes:     Pupils: Pupils are equal, round, and reactive to light.  Cardiovascular:     Rate and Rhythm: Normal rate and regular rhythm.     Pulses: Normal pulses.     Heart sounds: Normal heart sounds.  Pulmonary:     Effort: Pulmonary effort is normal.  Abdominal:     General: Abdomen is flat. Bowel sounds are normal.  Musculoskeletal:        General: Normal range of motion.     Cervical back: Normal range of motion.  Skin:    General: Skin is warm.  Neurological:     General: No focal deficit present.     Mental Status: He is alert.  Psychiatric:        Mood and Affect: Mood normal.       EKG:   Recent Labs: No results found for requested labs within last 365 days.    Lipid Panel    Component Value Date/Time   CHOL 220 (H) 07/20/2020  1219   TRIG 122 07/20/2020 1219   HDL 35 (L) 07/20/2020 1219   CHOLHDL 6.3 (H) 07/20/2020 1219   LDLCALC 161 (H) 07/20/2020 1219      Other studies Reviewed: Additional studies/ records that were reviewed today include:  Review of the above records demonstrates:       No data to display            ASSESSMENT AND PLAN:    ICD-10-CM   1. SOB (shortness of breath)  R06.02 PCV ECHOCARDIOGRAM COMPLETE    MYOCARDIAL PERFUSION IMAGING   does not have any energy, will get echo, stress test    2. Hypertensive heart disease, unspecified whether heart failure present  I11.9 PCV ECHOCARDIOGRAM COMPLETE    MYOCARDIAL PERFUSION IMAGING    3. Atrial fibrillation, unspecified type (HCC)  I48.91 PCV ECHOCARDIOGRAM COMPLETE    MYOCARDIAL PERFUSION IMAGING   still in NSR    4. Primary hypertension  I10 PCV ECHOCARDIOGRAM COMPLETE    MYOCARDIAL PERFUSION IMAGING    5. 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 Obesity (BMI 30-39.9)  E66.9 PCV ECHOCARDIOGRAM COMPLETE    MYOCARDIAL PERFUSION IMAGING       Problem List Items Addressed This Visit       Cardiovascular and Mediastinum   Hypertensive heart disease   Relevant Orders   PCV ECHOCARDIOGRAM COMPLETE   MYOCARDIAL PERFUSION IMAGING   Atrial fibrillation (HCC)   Relevant Orders   PCV ECHOCARDIOGRAM COMPLETE   MYOCARDIAL PERFUSION IMAGING   Hypertension   Relevant Orders   PCV ECHOCARDIOGRAM COMPLETE   MYOCARDIAL PERFUSION IMAGING   Varicose veins of bilateral lower extremities with other complications   Relevant Orders   PCV ECHOCARDIOGRAM COMPLETE   MYOCARDIAL PERFUSION IMAGING     Endocrine   Diabetes 1.5, managed as type 2 (HCC)   Relevant Orders   PCV ECHOCARDIOGRAM COMPLETE   MYOCARDIAL PERFUSION  IMAGING   Other Visit Diagnoses       SOB (shortness of breath)    -  Primary   does not have any energy, will get echo, stress test   Relevant Orders  PCV ECHOCARDIOGRAM COMPLETE   MYOCARDIAL PERFUSION IMAGING     Obesity (BMI 30-39.9)       Relevant Orders   PCV ECHOCARDIOGRAM COMPLETE   MYOCARDIAL PERFUSION IMAGING          Disposition:   Return in about 3 weeks (around 11/11/2023) for echo, stress test and f/u.    Total time spent: 35 minutes  Signed,  Debborah Fairly, MD  10/21/2023 10:19 AM    [facility]

## 2023-10-22 LAB — CMP14+EGFR
ALT: 16 IU/L (ref 0–44)
AST: 17 IU/L (ref 0–40)
Albumin: 4.5 g/dL (ref 3.9–4.9)
Alkaline Phosphatase: 86 IU/L (ref 44–121)
BUN/Creatinine Ratio: 15 (ref 10–24)
BUN: 17 mg/dL (ref 8–27)
Bilirubin Total: 0.9 mg/dL (ref 0.0–1.2)
CO2: 18 mmol/L — ABNORMAL LOW (ref 20–29)
Calcium: 9.2 mg/dL (ref 8.6–10.2)
Chloride: 108 mmol/L — ABNORMAL HIGH (ref 96–106)
Creatinine, Ser: 1.11 mg/dL (ref 0.76–1.27)
Globulin, Total: 2.5 g/dL (ref 1.5–4.5)
Glucose: 145 mg/dL — ABNORMAL HIGH (ref 70–99)
Potassium: 3.7 mmol/L (ref 3.5–5.2)
Sodium: 147 mmol/L — ABNORMAL HIGH (ref 134–144)
Total Protein: 7 g/dL (ref 6.0–8.5)
eGFR: 73 mL/min/{1.73_m2} (ref >59)

## 2023-10-22 LAB — LIPID PANEL
Chol/HDL Ratio: 6.2 ratio — ABNORMAL HIGH (ref 0.0–5.0)
Cholesterol, Total: 193 mg/dL (ref 100–199)
HDL: 31 mg/dL — ABNORMAL LOW (ref >39)
LDL Chol Calc (NIH): 137 mg/dL — ABNORMAL HIGH (ref 0–99)
Triglycerides: 136 mg/dL (ref 0–149)
VLDL Cholesterol Cal: 25 mg/dL (ref 5–40)

## 2023-10-22 LAB — HEMOGLOBIN A1C
Est. average glucose Bld gHb Est-mCnc: 246 mg/dL
Hgb A1c MFr Bld: 10.2 % — ABNORMAL HIGH (ref 4.8–5.6)

## 2023-10-22 LAB — TSH: TSH: 3.1 u[IU]/mL (ref 0.450–4.500)

## 2023-10-28 ENCOUNTER — Ambulatory Visit (INDEPENDENT_AMBULATORY_CARE_PROVIDER_SITE_OTHER)

## 2023-10-28 DIAGNOSIS — I119 Hypertensive heart disease without heart failure: Secondary | ICD-10-CM

## 2023-10-28 DIAGNOSIS — I361 Nonrheumatic tricuspid (valve) insufficiency: Secondary | ICD-10-CM

## 2023-10-28 DIAGNOSIS — I4891 Unspecified atrial fibrillation: Secondary | ICD-10-CM | POA: Diagnosis not present

## 2023-10-28 DIAGNOSIS — I340 Nonrheumatic mitral (valve) insufficiency: Secondary | ICD-10-CM

## 2023-10-28 DIAGNOSIS — E669 Obesity, unspecified: Secondary | ICD-10-CM

## 2023-10-28 DIAGNOSIS — I10 Essential (primary) hypertension: Secondary | ICD-10-CM

## 2023-10-28 DIAGNOSIS — I83893 Varicose veins of bilateral lower extremities with other complications: Secondary | ICD-10-CM

## 2023-10-28 DIAGNOSIS — R0602 Shortness of breath: Secondary | ICD-10-CM

## 2023-10-28 DIAGNOSIS — E139 Other specified diabetes mellitus without complications: Secondary | ICD-10-CM

## 2023-10-31 ENCOUNTER — Ambulatory Visit

## 2023-10-31 DIAGNOSIS — I4891 Unspecified atrial fibrillation: Secondary | ICD-10-CM | POA: Diagnosis not present

## 2023-10-31 DIAGNOSIS — I83893 Varicose veins of bilateral lower extremities with other complications: Secondary | ICD-10-CM

## 2023-10-31 DIAGNOSIS — I119 Hypertensive heart disease without heart failure: Secondary | ICD-10-CM

## 2023-10-31 DIAGNOSIS — E139 Other specified diabetes mellitus without complications: Secondary | ICD-10-CM

## 2023-10-31 DIAGNOSIS — R0602 Shortness of breath: Secondary | ICD-10-CM

## 2023-10-31 DIAGNOSIS — I10 Essential (primary) hypertension: Secondary | ICD-10-CM

## 2023-10-31 DIAGNOSIS — E669 Obesity, unspecified: Secondary | ICD-10-CM

## 2023-11-01 DIAGNOSIS — H1045 Other chronic allergic conjunctivitis: Secondary | ICD-10-CM | POA: Diagnosis not present

## 2023-11-01 DIAGNOSIS — H2513 Age-related nuclear cataract, bilateral: Secondary | ICD-10-CM | POA: Diagnosis not present

## 2023-11-01 DIAGNOSIS — H04129 Dry eye syndrome of unspecified lacrimal gland: Secondary | ICD-10-CM | POA: Diagnosis not present

## 2023-11-01 DIAGNOSIS — H52223 Regular astigmatism, bilateral: Secondary | ICD-10-CM | POA: Diagnosis not present

## 2023-11-01 DIAGNOSIS — Z7984 Long term (current) use of oral hypoglycemic drugs: Secondary | ICD-10-CM | POA: Diagnosis not present

## 2023-11-01 DIAGNOSIS — E119 Type 2 diabetes mellitus without complications: Secondary | ICD-10-CM | POA: Diagnosis not present

## 2023-11-01 DIAGNOSIS — H40023 Open angle with borderline findings, high risk, bilateral: Secondary | ICD-10-CM | POA: Diagnosis not present

## 2023-11-05 ENCOUNTER — Encounter: Payer: Self-pay | Admitting: Cardiovascular Disease

## 2023-11-05 ENCOUNTER — Ambulatory Visit: Admitting: Cardiovascular Disease

## 2023-11-05 VITALS — BP 130/80 | HR 76 | Ht 73.0 in | Wt 213.8 lb

## 2023-11-05 DIAGNOSIS — I119 Hypertensive heart disease without heart failure: Secondary | ICD-10-CM | POA: Diagnosis not present

## 2023-11-05 DIAGNOSIS — R0602 Shortness of breath: Secondary | ICD-10-CM

## 2023-11-05 DIAGNOSIS — I4891 Unspecified atrial fibrillation: Secondary | ICD-10-CM | POA: Diagnosis not present

## 2023-11-05 DIAGNOSIS — R0789 Other chest pain: Secondary | ICD-10-CM

## 2023-11-05 DIAGNOSIS — I10 Essential (primary) hypertension: Secondary | ICD-10-CM

## 2023-11-05 MED ORDER — ISOSORBIDE MONONITRATE ER 30 MG PO TB24: 30.0000 mg | ORAL_TABLET | Freq: Every day | ORAL | 1 refills | Status: DC

## 2023-11-05 NOTE — Progress Notes (Signed)
 Cardiology Office Note   Date:  11/05/2023   ID:  Jesse Humphrey, DOB 1955/08/28, MRN [id number]  PCP:  Alica Antu, NP  Cardiologist:  Debborah Fairly, MD      History of Present Illness: Jesse Humphrey is a 68 y.o. male who presents for  Chief Complaint  Patient presents with   Follow-up    3 week NST/Echo results    Doing good      Past Medical History:  Diagnosis Date   Atrial fibrillation (HCC)    Diabetes mellitus without complication (HCC)    Hypertension    Obesity (BMI 30-39.9) 02/10/2020     Past Surgical History:  Procedure Laterality Date   NO PAST SURGERIES       Current Outpatient Medications  Medication Sig Dispense Refill   dapagliflozin  propanediol (FARXIGA ) 10 MG TABS tablet Take 1 tablet (10 mg total) by mouth daily before breakfast. 21 tablet 0   digoxin  (LANOXIN ) 0.25 MG tablet Take 1 tablet (250 mcg total) by mouth daily. 90 tablet 3   doxazosin  (CARDURA ) 4 MG tablet Take 1 tablet (4 mg total) by mouth daily. 90 tablet 1   glipiZIDE  (GLUCOTROL  XL) 10 MG 24 hr tablet Take 1 tablet (10 mg total) by mouth daily. 90 tablet 1   isosorbide mononitrate (IMDUR) 30 MG 24 hr tablet Take 1 tablet (30 mg total) by mouth daily. 30 tablet 1   loratadine  (CLARITIN ) 10 MG tablet Take 1 tablet (10 mg total) by mouth daily. 30 tablet 1   metoprolol  (TOPROL -XL) 200 MG 24 hr tablet Take 1 tablet (200 mg total) by mouth daily. 90 tablet 1   olmesartan  (BENICAR ) 40 MG tablet TAKE ONE TABLET BY MOUTH DAILY 90 tablet 3   pioglitazone  (ACTOS ) 45 MG tablet TAKE ONE TABLET BY MOUTH DAILY 90 tablet 3   warfarin (COUMADIN ) 5 MG tablet Take 1 tablet (5 mg total) by mouth daily. 90 tablet 2   No current facility-administered medications for this visit.    Allergies:   Poison ivy extract and Poison oak extract    Social History:   reports that he has never smoked. He has never used smokeless tobacco. He reports that he does not drink alcohol and does not use drugs.    Family History:  family history includes Diabetes in his mother; Heart attack in his mother; Hypertension in his father and mother; Neuropathy in his father; Stroke in his mother; Varicose Veins in his mother.    ROS:     Review of Systems  Constitutional: Negative.   HENT: Negative.    Eyes: Negative.   Respiratory: Negative.    Gastrointestinal: Negative.   Genitourinary: Negative.   Musculoskeletal: Negative.   Skin: Negative.   Neurological: Negative.   Endo/Heme/Allergies: Negative.   Psychiatric/Behavioral: Negative.    All other systems reviewed and are negative.     All other systems are reviewed and negative.    PHYSICAL EXAM: VS:  BP 130/80   Pulse 76   Ht 6\' 1"  (1.854 m)   Wt 213 lb 12.8 oz (97 kg)   SpO2 98%   BMI 28.21 kg/m  , BMI Body mass index is 28.21 kg/m. Last weight:  Wt Readings from Last 3 Encounters:  11/05/23 213 lb 12.8 oz (97 kg)  10/21/23 221 lb (100.2 kg)  10/15/23 215 lb (97.5 kg)     Physical Exam Vitals reviewed.  Constitutional:      Appearance: Normal appearance. He is normal weight.  HENT:     Head: Normocephalic.     Nose: Nose normal.     Mouth/Throat:     Mouth: Mucous membranes are moist.  Eyes:     Pupils: Pupils are equal, round, and reactive to light.  Cardiovascular:     Rate and Rhythm: Normal rate and regular rhythm.     Pulses: Normal pulses.     Heart sounds: Normal heart sounds.  Pulmonary:     Effort: Pulmonary effort is normal.  Abdominal:     General: Abdomen is flat. Bowel sounds are normal.  Musculoskeletal:        General: Normal range of motion.     Cervical back: Normal range of motion.  Skin:    General: Skin is warm.  Neurological:     General: No focal deficit present.     Mental Status: He is alert.  Psychiatric:        Mood and Affect: Mood normal.       EKG:   Recent Labs: 10/21/2023: ALT 16; BUN 17; Creatinine, Ser 1.11; Potassium 3.7; Sodium 147; TSH 3.100    Lipid Panel     Component Value Date/Time   CHOL 193 10/21/2023 1040   TRIG 136 10/21/2023 1040   HDL 31 (L) 10/21/2023 1040   CHOLHDL 6.2 (H) 10/21/2023 1040   CHOLHDL 6.3 (H) 07/20/2020 1219   LDLCALC 137 (H) 10/21/2023 1040   LDLCALC 161 (H) 07/20/2020 1219      Other studies Reviewed: Additional studies/ records that were reviewed today include:  Review of the above records demonstrates:       No data to display            ASSESSMENT AND PLAN:    ICD-10-CM   1. SOB (shortness of breath)  R06.02 isosorbide mononitrate (IMDUR) 30 MG 24 hr tablet    2. Primary hypertension  I10 isosorbide mononitrate (IMDUR) 30 MG 24 hr tablet    3. Atrial fibrillation, unspecified type (HCC)  I48.91 isosorbide mononitrate (IMDUR) 30 MG 24 hr tablet    4. Hypertensive heart disease, unspecified whether heart failure present  I11.9 isosorbide mononitrate (IMDUR) 30 MG 24 hr tablet    5. Other chest pain  R07.89 isosorbide mononitrate (IMDUR) 30 MG 24 hr tablet   soreness in chest, stress test anteroapical revesible defect, EF 53%, add imdur and see if resolves, an if not do cath.       Problem List Items Addressed This Visit       Cardiovascular and Mediastinum   Hypertensive heart disease   Relevant Medications   isosorbide mononitrate (IMDUR) 30 MG 24 hr tablet   Atrial fibrillation (HCC)   Relevant Medications   isosorbide mononitrate (IMDUR) 30 MG 24 hr tablet   Hypertension   Relevant Medications   isosorbide mononitrate (IMDUR) 30 MG 24 hr tablet   Other Visit Diagnoses       SOB (shortness of breath)    -  Primary   Relevant Medications   isosorbide mononitrate (IMDUR) 30 MG 24 hr tablet     Other chest pain       soreness in chest, stress test anteroapical revesible defect, EF 53%, add imdur and see if resolves, an if not do cath.   Relevant Medications   isosorbide mononitrate (IMDUR) 30 MG 24 hr tablet          Disposition:   Return in about 4 weeks (around 12/03/2023).     Total time spent: 40  minutes  Signed,  Debborah Fairly, MD  11/05/2023 10:58 AM    [facility]

## 2023-11-06 DIAGNOSIS — M9903 Segmental and somatic dysfunction of lumbar region: Secondary | ICD-10-CM | POA: Diagnosis not present

## 2023-11-06 DIAGNOSIS — M5416 Radiculopathy, lumbar region: Secondary | ICD-10-CM | POA: Diagnosis not present

## 2023-11-06 DIAGNOSIS — M9905 Segmental and somatic dysfunction of pelvic region: Secondary | ICD-10-CM | POA: Diagnosis not present

## 2023-11-06 DIAGNOSIS — M62830 Muscle spasm of back: Secondary | ICD-10-CM | POA: Diagnosis not present

## 2023-12-05 ENCOUNTER — Encounter: Payer: Self-pay | Admitting: Cardiovascular Disease

## 2023-12-05 ENCOUNTER — Ambulatory Visit: Admitting: Cardiovascular Disease

## 2023-12-05 VITALS — BP 124/76 | HR 73 | Ht 73.0 in | Wt 213.0 lb

## 2023-12-05 DIAGNOSIS — I119 Hypertensive heart disease without heart failure: Secondary | ICD-10-CM

## 2023-12-05 DIAGNOSIS — R0602 Shortness of breath: Secondary | ICD-10-CM

## 2023-12-05 DIAGNOSIS — I83893 Varicose veins of bilateral lower extremities with other complications: Secondary | ICD-10-CM

## 2023-12-05 DIAGNOSIS — R0789 Other chest pain: Secondary | ICD-10-CM

## 2023-12-05 DIAGNOSIS — E782 Mixed hyperlipidemia: Secondary | ICD-10-CM

## 2023-12-05 DIAGNOSIS — I10 Essential (primary) hypertension: Secondary | ICD-10-CM

## 2023-12-05 DIAGNOSIS — I4891 Unspecified atrial fibrillation: Secondary | ICD-10-CM

## 2023-12-05 MED ORDER — ROSUVASTATIN CALCIUM 5 MG PO TABS
5.0000 mg | ORAL_TABLET | Freq: Every day | ORAL | 11 refills | Status: DC
Start: 2023-12-05 — End: 2024-04-15

## 2023-12-05 NOTE — Progress Notes (Signed)
 Cardiology Office Note   Date:  12/05/2023   ID:  Jesse Humphrey, DOB [birth year], MRN [id number]  PCP:  Alica Antu, NP  Cardiologist:  Debborah Fairly, MD      History of Present Illness: Jesse Humphrey is a 68 y.o. male who presents for  Chief Complaint  Patient presents with   Follow-up    1 Month Follow Up    Doing well      Past Medical History:  Diagnosis Date   Atrial fibrillation (HCC)    Diabetes mellitus without complication (HCC)    Hypertension    Obesity (BMI 30-39.9) 02/10/2020     Past Surgical History:  Procedure Laterality Date   NO PAST SURGERIES       Current Outpatient Medications  Medication Sig Dispense Refill   dapagliflozin  propanediol (FARXIGA ) 10 MG TABS tablet Take 1 tablet (10 mg total) by mouth daily before breakfast. 21 tablet 0   digoxin  (LANOXIN ) 0.25 MG tablet Take 1 tablet (250 mcg total) by mouth daily. 90 tablet 3   doxazosin  (CARDURA ) 4 MG tablet Take 1 tablet (4 mg total) by mouth daily. 90 tablet 1   glipiZIDE  (GLUCOTROL  XL) 10 MG 24 hr tablet Take 1 tablet (10 mg total) by mouth daily. 90 tablet 1   isosorbide  mononitrate (IMDUR ) 30 MG 24 hr tablet Take 1 tablet (30 mg total) by mouth daily. 30 tablet 1   loratadine  (CLARITIN ) 10 MG tablet Take 1 tablet (10 mg total) by mouth daily. 30 tablet 1   metoprolol  (TOPROL -XL) 200 MG 24 hr tablet Take 1 tablet (200 mg total) by mouth daily. 90 tablet 1   olmesartan  (BENICAR ) 40 MG tablet TAKE ONE TABLET BY MOUTH DAILY 90 tablet 3   pioglitazone  (ACTOS ) 45 MG tablet TAKE ONE TABLET BY MOUTH DAILY 90 tablet 3   rosuvastatin (CRESTOR) 5 MG tablet Take 1 tablet (5 mg total) by mouth daily. 30 tablet 11   warfarin (COUMADIN ) 5 MG tablet Take 1 tablet (5 mg total) by mouth daily. 90 tablet 2   No current facility-administered medications for this visit.    Allergies:   Poison ivy extract and Poison oak extract    Social History:   reports that he has never smoked. He has never  used smokeless tobacco. He reports that he does not drink alcohol and does not use drugs.   Family History:  family history includes Diabetes in his mother; Heart attack in his mother; Hypertension in his father and mother; Neuropathy in his father; Stroke in his mother; Varicose Veins in his mother.    ROS:     Review of Systems  Constitutional: Negative.   HENT: Negative.    Eyes: Negative.   Respiratory: Negative.    Gastrointestinal: Negative.   Genitourinary: Negative.   Musculoskeletal: Negative.   Skin: Negative.   Neurological: Negative.   Endo/Heme/Allergies: Negative.   Psychiatric/Behavioral: Negative.    All other systems reviewed and are negative.     All other systems are reviewed and negative.    PHYSICAL EXAM: VS:  BP 124/76   Pulse 73   Ht 6\' 1"  (1.854 m)   Wt 213 lb (96.6 kg)   SpO2 96%   BMI 28.10 kg/m  , BMI Body mass index is 28.1 kg/m. Last weight:  Wt Readings from Last 3 Encounters:  12/05/23 213 lb (96.6 kg)  11/05/23 213 lb 12.8 oz (97 kg)  10/21/23 221 lb (100.2 kg)     Physical  Exam    EKG:   Recent Labs: 10/21/2023: ALT 16; BUN 17; Creatinine, Ser 1.11; Potassium 3.7; Sodium 147; TSH 3.100    Lipid Panel    Component Value Date/Time   CHOL 193 10/21/2023 1040   TRIG 136 10/21/2023 1040   HDL 31 (L) 10/21/2023 1040   CHOLHDL 6.2 (H) 10/21/2023 1040   CHOLHDL 6.3 (H) 07/20/2020 1219   LDLCALC 137 (H) 10/21/2023 1040   LDLCALC 161 (H) 07/20/2020 1219      Other studies Reviewed: Additional studies/ records that were reviewed today include:  Review of the above records demonstrates:       No data to display            ASSESSMENT AND PLAN:    ICD-10-CM   1. SOB (shortness of breath)  R06.02 rosuvastatin (CRESTOR) 5 MG tablet    2. Primary hypertension  I10 rosuvastatin (CRESTOR) 5 MG tablet    3. Atrial fibrillation, unspecified type (HCC)  I48.91 rosuvastatin (CRESTOR) 5 MG tablet    4. Hypertensive heart  disease, unspecified whether heart failure present  I11.9 rosuvastatin (CRESTOR) 5 MG tablet    5. Other chest pain  R07.89 rosuvastatin (CRESTOR) 5 MG tablet   stress test showed anterolateral reversible defect with Llvef 53%, ASYMPTOMATIC, does not want cardia cath    6. Varicose veins of bilateral lower extremities with other complications  I83.893 rosuvastatin (CRESTOR) 5 MG tablet    7. Mixed hyperlipidemia  E78.2 rosuvastatin (CRESTOR) 5 MG tablet   LDL 170, ADD crestor 5, H AS  been UNABLE TO TOLERATE STATINS, simvistatin       Problem List Items Addressed This Visit       Cardiovascular and Mediastinum   Hypertensive heart disease   Relevant Medications   rosuvastatin (CRESTOR) 5 MG tablet   Atrial fibrillation (HCC)   Relevant Medications   rosuvastatin (CRESTOR) 5 MG tablet   Hypertension   Relevant Medications   rosuvastatin (CRESTOR) 5 MG tablet   Varicose veins of bilateral lower extremities with other complications   Relevant Medications   rosuvastatin (CRESTOR) 5 MG tablet   Other Visit Diagnoses       SOB (shortness of breath)    -  Primary   Relevant Medications   rosuvastatin (CRESTOR) 5 MG tablet     Other chest pain       stress test showed anterolateral reversible defect with Llvef 53%, ASYMPTOMATIC, does not want cardia cath   Relevant Medications   rosuvastatin (CRESTOR) 5 MG tablet     Mixed hyperlipidemia       LDL 170, ADD crestor 5, H AS  been UNABLE TO TOLERATE STATINS, simvistatin   Relevant Medications   rosuvastatin (CRESTOR) 5 MG tablet          Disposition:   Return in about 2 weeks (around 12/19/2023).    Total time spent: 30 minutes  Signed,  Debborah Fairly, MD  12/05/2023 10:51 AM    [facility]

## 2023-12-06 ENCOUNTER — Ambulatory Visit: Admitting: Cardiovascular Disease

## 2024-01-06 ENCOUNTER — Ambulatory Visit: Admitting: Cardiovascular Disease

## 2024-02-18 NOTE — Progress Notes (Signed)
   02/18/2024  Patient ID: Jesse Humphrey, male   DOB: 08-03-1955, 68 y.o.   MRN: [id number]  Pharmacy Quality Measure Review  This patient is appearing on a report for being at risk of failing the adherence measure for hypertension (ACEi/ARB) medications this calendar year.   Medication: Olmesartan  Last fill date: 12/18/23 for 90 day supply  Insurance report was not up to date. No action needed at this time.   Jon VEAR Lindau, PharmD Clinical Pharmacist [phone]

## 2024-02-20 DIAGNOSIS — H40023 Open angle with borderline findings, high risk, bilateral: Secondary | ICD-10-CM | POA: Diagnosis not present

## 2024-02-20 DIAGNOSIS — H2513 Age-related nuclear cataract, bilateral: Secondary | ICD-10-CM | POA: Diagnosis not present

## 2024-02-20 DIAGNOSIS — Z7984 Long term (current) use of oral hypoglycemic drugs: Secondary | ICD-10-CM | POA: Diagnosis not present

## 2024-02-20 DIAGNOSIS — E119 Type 2 diabetes mellitus without complications: Secondary | ICD-10-CM | POA: Diagnosis not present

## 2024-02-20 DIAGNOSIS — H1045 Other chronic allergic conjunctivitis: Secondary | ICD-10-CM | POA: Diagnosis not present

## 2024-02-20 DIAGNOSIS — H04129 Dry eye syndrome of unspecified lacrimal gland: Secondary | ICD-10-CM | POA: Diagnosis not present

## 2024-02-25 NOTE — Progress Notes (Signed)
   02/25/2024  Patient ID: Jesse Humphrey, male   DOB: [birth year], 68 y.o.   MRN: [id number]  Pharmacy Quality Measure Review  This patient is appearing on a report for being at risk of failing the adherence measure for cholesterol (statin) medications this calendar year.   Medication: Rosuvastatin  5mg  Last fill date: 01/02/24 for 90 day supply  Insurance report was not up to date. No action needed at this time.   Jon VEAR Lindau, PharmD Clinical Pharmacist [phone]

## 2024-03-03 ENCOUNTER — Ambulatory Visit (INDEPENDENT_AMBULATORY_CARE_PROVIDER_SITE_OTHER): Admitting: Vascular Surgery

## 2024-03-03 ENCOUNTER — Encounter (INDEPENDENT_AMBULATORY_CARE_PROVIDER_SITE_OTHER): Payer: Self-pay | Admitting: Vascular Surgery

## 2024-03-03 VITALS — BP 126/74 | HR 61 | Ht 73.0 in | Wt 211.0 lb

## 2024-03-03 DIAGNOSIS — I872 Venous insufficiency (chronic) (peripheral): Secondary | ICD-10-CM

## 2024-03-03 DIAGNOSIS — I10 Essential (primary) hypertension: Secondary | ICD-10-CM | POA: Diagnosis not present

## 2024-03-03 DIAGNOSIS — I83893 Varicose veins of bilateral lower extremities with other complications: Secondary | ICD-10-CM

## 2024-03-03 DIAGNOSIS — E119 Type 2 diabetes mellitus without complications: Secondary | ICD-10-CM | POA: Diagnosis not present

## 2024-03-03 NOTE — Progress Notes (Signed)
 MRN : [id number]  Jesse Humphrey is a 68 y.o. (02/13/1956) male who presents with chief complaint of  Chief Complaint  Patient presents with   Follow-up    - fu 6 months see JD no studies   .  History of Present Illness: Patient returns today in follow up of his venous insufficiency.  He is doing well.  He developed some small scabs and wounds on the legs after the initiation of Crestor  a few months ago.  These have been persistent and bothersome.  They itch a lot and he does scratch them which sometimes leads them to lasting longer.  He really has had very mild swelling.  He does wear his compression socks regularly.  He elevates his legs.  His stasis dermatitis changes are still present.  He does notice they actually lighten when he does more vigorous exercise.  Not really having any pain.  Current Outpatient Medications  Medication Sig Dispense Refill   dapagliflozin  propanediol (FARXIGA ) 10 MG TABS tablet Take 1 tablet (10 mg total) by mouth daily before breakfast. 21 tablet 0   digoxin  (LANOXIN ) 0.25 MG tablet Take 1 tablet (250 mcg total) by mouth daily. 90 tablet 3   doxazosin  (CARDURA ) 4 MG tablet Take 1 tablet (4 mg total) by mouth daily. 90 tablet 1   glipiZIDE  (GLUCOTROL  XL) 10 MG 24 hr tablet Take 1 tablet (10 mg total) by mouth daily. 90 tablet 1   loratadine  (CLARITIN ) 10 MG tablet Take 1 tablet (10 mg total) by mouth daily. 30 tablet 1   metoprolol  (TOPROL -XL) 200 MG 24 hr tablet Take 1 tablet (200 mg total) by mouth daily. 90 tablet 1   olmesartan  (BENICAR ) 40 MG tablet TAKE ONE TABLET BY MOUTH DAILY 90 tablet 3   pioglitazone  (ACTOS ) 45 MG tablet TAKE ONE TABLET BY MOUTH DAILY 90 tablet 3   rosuvastatin  (CRESTOR ) 5 MG tablet Take 1 tablet (5 mg total) by mouth daily. 30 tablet 11   warfarin (COUMADIN ) 5 MG tablet Take 1 tablet (5 mg total) by mouth daily. 90 tablet 2   isosorbide  mononitrate (IMDUR ) 30 MG 24 hr tablet Take 1 tablet (30 mg total) by mouth daily. (Patient  not taking: Reported on 03/03/2024) 30 tablet 1   No current facility-administered medications for this visit.    Past Medical History:  Diagnosis Date   Atrial fibrillation (HCC)    Diabetes mellitus without complication (HCC)    Hypertension    Obesity (BMI 30-39.9) 02/10/2020    Past Surgical History:  Procedure Laterality Date   NO PAST SURGERIES       Social History   Tobacco Use   Smoking status: Never   Smokeless tobacco: Never  Substance Use Topics   Alcohol use: Never   Drug use: Never       Family History  Problem Relation Age of Onset   Hypertension Mother    Diabetes Mother    Stroke Mother    Varicose Veins Mother    Heart attack Mother    Hypertension Father    Neuropathy Father      Allergies  Allergen Reactions   Poison [employer] Extract    Poison Oak Extract      REVIEW OF SYSTEMS (Negative unless checked)   Constitutional: [] Weight loss  [] Fever  [] Chills Cardiac: [] Chest pain   [] Chest pressure   [x] Palpitations   [] Shortness of breath when laying flat   [] Shortness of breath at rest   [] Shortness of breath  with exertion. Vascular:  [] Pain in legs with walking   [] Pain in legs at rest   [] Pain in legs when laying flat   [] Claudication   [] Pain in feet when walking  [] Pain in feet at rest  [] Pain in feet when laying flat   [] History of DVT   [] Phlebitis   [x] Swelling in legs   [x] Varicose veins   [] Non-healing ulcers Pulmonary:   [] Uses home oxygen   [] Productive cough   [] Hemoptysis   [] Wheeze  [] COPD   [] Asthma Neurologic:  [] Dizziness  [] Blackouts   [] Seizures   [] History of stroke   [] History of TIA  [] Aphasia   [] Temporary blindness   [] Dysphagia   [] Weakness or numbness in arms   [] Weakness or numbness in legs Musculoskeletal:  [x] Arthritis   [] Joint swelling   [] Joint pain   [] Low back pain Hematologic:  [] Easy bruising  [] Easy bleeding   [] Hypercoagulable state   [] Anemic  [] Hepatitis Gastrointestinal:  [] Blood in stool   [] Vomiting blood   [] Gastroesophageal reflux/heartburn   [] Abdominal pain Genitourinary:  [] Chronic kidney disease   [] Difficult urination  [] Frequent urination  [] Burning with urination   [] Hematuria Skin:  [] Rashes   [] Ulcers   [] Wounds Psychological:  [] History of anxiety   []  History of major depression.  Physical Examination  BP 126/74   Pulse 61   Ht 6' 1 (1.854 m)   Wt 211 lb (95.7 kg)   BMI 27.84 kg/m  Gen:  WD/WN, NAD.  Appears younger than stated age Head: [place]/AT, No temporalis wasting. Ear/Nose/Throat: Hearing grossly intact, nares w/o erythema or drainage Eyes: Conjunctiva clear. Sclera non-icteric Neck: Supple.  Trachea midline Pulmonary:  Good air movement, no use of accessory muscles.  Cardiac: RRR, no JVD Vascular:  Vessel Right Left  Radial Palpable Palpable                          PT Palpable Palpable  DP Palpable Palpable   Gastrointestinal: soft, non-tender/non-distended. No guarding/reflex.  Musculoskeletal: M/S 5/5 throughout.  No deformity or atrophy.  Moderate to severe stasis dermatitis changes present bilaterally, a little worse on the right than the left.  He has some small scab areas that have the appearance of a bug bite throughout both legs and the lower legs.  Trace bilateral lower extremity edema. Neurologic: Sensation grossly intact in extremities.  Symmetrical.  Speech is fluent.  Psychiatric: Judgment intact, Mood & affect appropriate for pt's clinical situation. Dermatologic: No rashes or ulcers noted.  No cellulitis or open wounds.      Labs No results found for this or any previous visit (from the past 2160 hours).  Radiology No results found.  Assessment/Plan  Varicose veins of bilateral lower extremities with other complications His symptom control remains good with conservative measures.  He does not want to have any intervention and I would agree with this.  I am not sure what to make of the skin lesions, but I recommended he consider seeing  a dermatologist.  This could be an allergic reaction to Crestor  although I am not entirely sure.  I will plan to see him back in 6 months.  Type 2 diabetes mellitus without complication (HCC) blood glucose control important in reducing the progression of atherosclerotic disease. Also, involved in wound healing. On appropriate medications.  Hypertension blood pressure control important in reducing the progression of atherosclerotic disease. On appropriate oral medications.     Chronic venous stasis dermatitis Stable and due  to significant venous disease.  Not currently that bothersome to him.  Selinda Gu, MD  03/03/2024 11:38 AM    This note was created with Dragon medical transcription system.  Any errors from dictation are purely unintentional

## 2024-03-03 NOTE — Assessment & Plan Note (Signed)
 blood glucose control important in reducing the progression of atherosclerotic disease. Also, involved in wound healing. On appropriate medications.

## 2024-03-03 NOTE — Assessment & Plan Note (Signed)
 His symptom control remains good with conservative measures.  He does not want to have any intervention and I would agree with this.  I am not sure what to make of the skin lesions, but I recommended he consider seeing a dermatologist.  This could be an allergic reaction to Crestor  although I am not entirely sure.  I will plan to see him back in 6 months.

## 2024-03-18 ENCOUNTER — Encounter: Payer: Self-pay | Admitting: *Deleted

## 2024-03-24 ENCOUNTER — Telehealth: Payer: Self-pay

## 2024-03-24 NOTE — Progress Notes (Signed)
   03/24/2024  Patient ID: Jesse Humphrey, male   DOB: 12-May-1956, 68 y.o.   MRN: [id number]  Pharmacy Quality Measure Review  This patient is appearing on a report for being at risk of failing the adherence measure for hypertension (ACEi/ARB) medications this calendar year.   Medication: Olmesartan  and Pioglitazone  Last fill date:03/19/24 for 90 day supply  Insurance report was not up to date. No action needed at this time.  Verified fill date with pharmacy  Jon VEAR [place], PharmD Clinical Pharmacist [phone]

## 2024-03-25 ENCOUNTER — Encounter: Payer: Self-pay | Admitting: Cardiovascular Disease

## 2024-03-25 ENCOUNTER — Ambulatory Visit: Attending: Cardiovascular Disease | Admitting: Cardiovascular Disease

## 2024-03-25 VITALS — BP 130/80 | HR 61 | Ht 73.0 in | Wt 218.4 lb

## 2024-03-25 DIAGNOSIS — I4891 Unspecified atrial fibrillation: Secondary | ICD-10-CM | POA: Diagnosis not present

## 2024-03-25 DIAGNOSIS — I119 Hypertensive heart disease without heart failure: Secondary | ICD-10-CM

## 2024-03-25 DIAGNOSIS — I4821 Permanent atrial fibrillation: Secondary | ICD-10-CM

## 2024-03-25 DIAGNOSIS — I10 Essential (primary) hypertension: Secondary | ICD-10-CM

## 2024-03-25 DIAGNOSIS — E785 Hyperlipidemia, unspecified: Secondary | ICD-10-CM | POA: Diagnosis not present

## 2024-03-25 MED ORDER — APIXABAN 5 MG PO TABS: 5.0000 mg | ORAL_TABLET | Freq: Two times a day (BID) | ORAL | 11 refills | Status: AC

## 2024-03-25 MED ORDER — DIGOXIN 125 MCG PO TABS: 125.0000 ug | ORAL_TABLET | Freq: Every day | ORAL | 1 refills | Status: DC

## 2024-03-25 NOTE — Patient Instructions (Addendum)
 Medication Instructions:  STOP the Warfarin  START Eliquis  5 mg twice daily. Start this 2 days after you have stopped the warfarin.   DECREASE the Digoxin  to 0.125 mg once daily  *If you need a refill on your cardiac medications before your next appointment, please call your pharmacy*  Lab Work: Your provider would like for you to have the following labs today: CBC, Digoxin , Lipid , PT-INR, and CMET  If you have labs (blood work) drawn today and your tests are completely normal, you will receive your results only by: MyChart Message (if you have MyChart) OR A paper copy in the mail If you have any lab test that is abnormal or we need to change your treatment, we will call you to review the results.  Testing/Procedures: None ordered  Follow-Up: At [facility], you and your health needs are our priority.  As part of our continuing mission to provide you with exceptional heart care, our providers are all part of one team.  This team includes your primary Cardiologist (physician) and Advanced Practice Providers or APPs (Physician Assistants and Nurse Practitioners) who all work together to provide you with the care you need, when you need it.  Your next appointment:   4 month(s)  Provider:   You may see Dr. Darron or one of the following Advanced Practice Providers on your designated Care Team:   Lonni Meager, NP Lesley Maffucci, PA-C Bernardino Bring, PA-C Cadence [place], PA-C Tylene Lunch, NP Barnie Hila, NP    We recommend signing up for the patient portal called MyChart.  Sign up information is provided on this After Visit Summary.  MyChart is used to connect with patients for Virtual Visits (Telemedicine).  Patients are able to view lab/test results, encounter notes, upcoming appointments, etc.  Non-urgent messages can be sent to your provider as well.   To learn more about what you can do with MyChart, go to [email or web address].

## 2024-03-25 NOTE — Progress Notes (Signed)
 Cardiology Office Note   Date:  03/25/2024   ID:  Jesse Humphrey, DOB [birth year], MRN [id number]  PCP:  Carin Gauze, NP  Cardiologist:   Deatrice Cage, MD   Chief Complaint  Patient presents with   New Patient (Initial Visit)    AFIB no complaints today. Meds reviewed verbally with pt.      History of Present Illness: Jesse Humphrey is a 68 y.o. male who presents to establish cardiovascular care. He has known history of permanent atrial fibrillation, type 2 diabetes, essential hypertension, hyperlipidemia and obesity. He was previously followed by Dr. Britta and most recently Dr. Fernand.  He has known history of permanent atrial fibrillation diagnosed at least since 2020.  He has been treated with rate control and anticoagulation with warfarin.  Given minimal symptoms, rhythm control was never pursued.  He is a lifelong non-smoker.  He denies any chest pain or shortness of breath but does report some mild fatigue and insomnia. He is on warfarin but his INR has not been checked on a regular basis.  He had an echocardiogram in April 2025 which showed mild LVH, normal LV systolic function, mild mitral regurgitation and mild pulmonary hypertension.  A Lexiscan Myoview in May showed possible anterior wall ischemia with an ejection fraction of 53%.  Cardiac catheterization was suggested but the patient declined given the lack of symptoms.  Past Medical History:  Diagnosis Date   Atrial fibrillation (HCC)    Diabetes mellitus without complication (HCC)    Hypertension    Obesity (BMI 30-39.9) 02/10/2020    Past Surgical History:  Procedure Laterality Date   NO PAST SURGERIES       Current Outpatient Medications  Medication Sig Dispense Refill   dapagliflozin  propanediol (FARXIGA ) 10 MG TABS tablet Take 1 tablet (10 mg total) by mouth daily before breakfast. 21 tablet 0   digoxin  (LANOXIN ) 0.25 MG tablet Take 1 tablet (250 mcg total) by mouth daily. 90 tablet 3   doxazosin   (CARDURA ) 4 MG tablet Take 1 tablet (4 mg total) by mouth daily. 90 tablet 1   glipiZIDE  (GLUCOTROL  XL) 10 MG 24 hr tablet Take 1 tablet (10 mg total) by mouth daily. 90 tablet 1   loratadine  (CLARITIN ) 10 MG tablet Take 1 tablet (10 mg total) by mouth daily. (Patient taking differently: Take 10 mg by mouth daily as needed.) 30 tablet 1   metoprolol  (TOPROL -XL) 200 MG 24 hr tablet Take 1 tablet (200 mg total) by mouth daily. 90 tablet 1   olmesartan  (BENICAR ) 40 MG tablet TAKE ONE TABLET BY MOUTH DAILY 90 tablet 3   pioglitazone  (ACTOS ) 45 MG tablet TAKE ONE TABLET BY MOUTH DAILY 90 tablet 3   rosuvastatin  (CRESTOR ) 5 MG tablet Take 1 tablet (5 mg total) by mouth daily. 30 tablet 11   No current facility-administered medications for this visit.    Allergies:   Poison ivy extract and Poison oak extract    Social History:  The patient  reports that he has never smoked. He has never used smokeless tobacco. He reports that he does not drink alcohol and does not use drugs.   Family History:  The patient's family history includes Diabetes in his mother; Heart attack in his mother; Hypertension in his father and mother; Neuropathy in his father; Stroke in his mother; Varicose Veins in his mother.    ROS:  Please see the history of present illness.   Otherwise, review of systems are positive for .  All other systems are reviewed and negative.    PHYSICAL EXAM: VS:  BP 130/80 (BP Location: Right Arm, Cuff Size: Large)   Pulse 61   Ht 6' 1 (1.854 m)   Wt 218 lb 6 oz (99.1 kg)   SpO2 99%   BMI 28.81 kg/m  , BMI Body mass index is 28.81 kg/m. GEN: Well nourished, well developed, in no acute distress  HEENT: normal  Neck: no JVD, carotid bruits, or masses Cardiac: Irregularly irregular; no murmurs, rubs, or gallops,no edema  Respiratory:  clear to auscultation bilaterally, normal work of breathing GI: soft, nontender, nondistended, + BS MS: no deformity or atrophy  Skin: warm and dry, no  rash Neuro:  Strength and sensation are intact Psych: euthymic mood, full affect   EKG:  EKG is ordered today. The ekg ordered today demonstrates : Atrial fibrillation No significant ST or T wave changes.      Recent Labs: 10/21/2023: ALT 16; BUN 17; Creatinine, Ser 1.11; Potassium 3.7; Sodium 147; TSH 3.100    Lipid Panel    Component Value Date/Time   CHOL 193 10/21/2023 1040   TRIG 136 10/21/2023 1040   HDL 31 (L) 10/21/2023 1040   CHOLHDL 6.2 (H) 10/21/2023 1040   CHOLHDL 6.3 (H) 07/20/2020 1219   LDLCALC 137 (H) 10/21/2023 1040   LDLCALC 161 (H) 07/20/2020 1219      Wt Readings from Last 3 Encounters:  03/25/24 218 lb 6 oz (99.1 kg)  03/03/24 211 lb (95.7 kg)  12/05/23 213 lb (96.6 kg)          03/25/2024    9:53 AM  PAD Screen  Previous PAD dx? Yes  Previous surgical procedure? No  Pain with walking? No  Feet/toe relief with dangling? No  Painful, non-healing ulcers? No  Extremities discolored? No      ASSESSMENT AND PLAN:  1.  Permanent atrial fibrillation: Ventricular rate is well-controlled and if anything he is on the bradycardic side.  Will check digoxin  level and we will go ahead and decrease the dose to 0.125 mg once daily and hopefully discontinue this medication altogether in the near future.  Continue Toprol  200 mg once daily. He is on anticoagulation with warfarin but has not been getting his INR checked.  I explained to him that this is not the standard of care.  I did discuss with him switching to a DOAC and he is agreeable. Will go ahead and start Eliquis  5 mg twice daily 2 days after he stops warfarin.  2.  Mildly abnormal nuclear stress test suggestive of mild anterior wall ischemia.  However, the patient has no symptoms suggestive of angina.  Continue medical therapy for now.  Suspect falsely positive stress test.  3.  Essential hypertension: Blood pressure is controlled on current medications.  4.  Hyperlipidemia: Currently on small  dose rosuvastatin  5 mg daily.  Will check lipid profile today and consider increasing the dose to get his LDL below 70 given that he is diabetic.    Disposition:   FU with me in 4 months  Signed,  Deatrice Cage, MD  03/25/2024 10:29 AM    [facility]

## 2024-03-26 ENCOUNTER — Ambulatory Visit: Payer: Self-pay | Admitting: Cardiovascular Disease

## 2024-03-26 DIAGNOSIS — I4821 Permanent atrial fibrillation: Secondary | ICD-10-CM

## 2024-03-26 DIAGNOSIS — Z5181 Encounter for therapeutic drug level monitoring: Secondary | ICD-10-CM

## 2024-03-26 LAB — COMPREHENSIVE METABOLIC PANEL WITH GFR
ALT: 16 IU/L (ref 0–44)
AST: 16 IU/L (ref 0–40)
Albumin: 4.7 g/dL (ref 3.9–4.9)
Alkaline Phosphatase: 95 IU/L (ref 47–123)
BUN/Creatinine Ratio: 11 (ref 10–24)
BUN: 15 mg/dL (ref 8–27)
Bilirubin Total: 1.4 mg/dL — ABNORMAL HIGH (ref 0.0–1.2)
CO2: 23 mmol/L (ref 20–29)
Calcium: 9.5 mg/dL (ref 8.6–10.2)
Chloride: 103 mmol/L (ref 96–106)
Creatinine, Ser: 1.37 mg/dL — ABNORMAL HIGH (ref 0.76–1.27)
Globulin, Total: 2.4 g/dL (ref 1.5–4.5)
Glucose: 242 mg/dL — ABNORMAL HIGH (ref 70–99)
Potassium: 3.9 mmol/L (ref 3.5–5.2)
Sodium: 140 mmol/L (ref 134–144)
Total Protein: 7.1 g/dL (ref 6.0–8.5)
eGFR: 57 mL/min/1.73 — ABNORMAL LOW (ref >59)

## 2024-03-26 LAB — LIPID PANEL
Chol/HDL Ratio: 3.7 ratio (ref 0.0–5.0)
Cholesterol, Total: 140 mg/dL (ref 100–199)
HDL: 38 mg/dL — ABNORMAL LOW (ref >39)
LDL Chol Calc (NIH): 80 mg/dL (ref 0–99)
Triglycerides: 123 mg/dL (ref 0–149)
VLDL Cholesterol Cal: 22 mg/dL (ref 5–40)

## 2024-03-26 LAB — CBC
Hematocrit: 42.2 % (ref 37.5–51.0)
Hemoglobin: 13.4 g/dL (ref 13.0–17.7)
MCH: 29.4 pg (ref 26.6–33.0)
MCHC: 31.8 g/dL (ref 31.5–35.7)
MCV: 93 fL (ref 79–97)
Platelets: 148 x10E3/uL — ABNORMAL LOW (ref 150–450)
RBC: 4.56 x10E6/uL (ref 4.14–5.80)
RDW: 13.8 % (ref 11.6–15.4)
WBC: 7.2 x10E3/uL (ref 3.4–10.8)

## 2024-03-26 LAB — PROTIME-INR
INR: 1.4 — ABNORMAL HIGH (ref 0.9–1.2)
Prothrombin Time: 15.1 s — ABNORMAL HIGH (ref 9.1–12.0)

## 2024-03-26 LAB — DIGOXIN LEVEL: Digoxin, Serum: 1.7 ng/mL — ABNORMAL HIGH (ref 0.5–0.9)

## 2024-04-08 ENCOUNTER — Other Ambulatory Visit: Payer: Self-pay | Admitting: Cardiology

## 2024-04-15 ENCOUNTER — Encounter: Payer: Self-pay | Admitting: Cardiology

## 2024-04-15 ENCOUNTER — Ambulatory Visit (INDEPENDENT_AMBULATORY_CARE_PROVIDER_SITE_OTHER): Admitting: Cardiology

## 2024-04-15 VITALS — BP 136/84 | HR 83 | Ht 73.0 in | Wt 215.4 lb

## 2024-04-15 DIAGNOSIS — E782 Mixed hyperlipidemia: Secondary | ICD-10-CM

## 2024-04-15 DIAGNOSIS — R0789 Other chest pain: Secondary | ICD-10-CM | POA: Diagnosis not present

## 2024-04-15 DIAGNOSIS — R0602 Shortness of breath: Secondary | ICD-10-CM

## 2024-04-15 DIAGNOSIS — I4891 Unspecified atrial fibrillation: Secondary | ICD-10-CM

## 2024-04-15 DIAGNOSIS — I119 Hypertensive heart disease without heart failure: Secondary | ICD-10-CM | POA: Diagnosis not present

## 2024-04-15 DIAGNOSIS — I83893 Varicose veins of bilateral lower extremities with other complications: Secondary | ICD-10-CM

## 2024-04-15 DIAGNOSIS — Z125 Encounter for screening for malignant neoplasm of prostate: Secondary | ICD-10-CM | POA: Diagnosis not present

## 2024-04-15 DIAGNOSIS — E119 Type 2 diabetes mellitus without complications: Secondary | ICD-10-CM | POA: Diagnosis not present

## 2024-04-15 DIAGNOSIS — I10 Essential (primary) hypertension: Secondary | ICD-10-CM | POA: Diagnosis not present

## 2024-04-15 MED ORDER — MUPIROCIN CALCIUM 2 % EX CREA: 1.0000 | TOPICAL_CREAM | Freq: Two times a day (BID) | CUTANEOUS | 3 refills | Status: AC

## 2024-04-15 MED ORDER — GLIPIZIDE ER 10 MG PO TB24: 10.0000 mg | ORAL_TABLET | Freq: Every day | ORAL | 1 refills | Status: AC

## 2024-04-15 MED ORDER — DAPAGLIFLOZIN PROPANEDIOL 10 MG PO TABS: 10.0000 mg | ORAL_TABLET | Freq: Every day | ORAL | 1 refills | Status: AC

## 2024-04-15 MED ORDER — DOXAZOSIN MESYLATE 4 MG PO TABS: 4.0000 mg | ORAL_TABLET | Freq: Every day | ORAL | 1 refills | Status: AC

## 2024-04-15 MED ORDER — ROSUVASTATIN CALCIUM 5 MG PO TABS: 5.0000 mg | ORAL_TABLET | Freq: Every day | ORAL | 1 refills | Status: AC

## 2024-04-15 MED ORDER — OLMESARTAN MEDOXOMIL 40 MG PO TABS: 40.0000 mg | ORAL_TABLET | Freq: Every day | ORAL | 1 refills | Status: AC

## 2024-04-15 MED ORDER — PIOGLITAZONE HCL 45 MG PO TABS: 45.0000 mg | ORAL_TABLET | Freq: Every day | ORAL | 1 refills | Status: AC

## 2024-04-15 MED ORDER — METOPROLOL SUCCINATE ER 200 MG PO TB24: 200.0000 mg | ORAL_TABLET | Freq: Every day | ORAL | 1 refills | Status: AC

## 2024-04-15 NOTE — Progress Notes (Signed)
 Established Patient Office Visit  Subjective:  Patient ID: Jesse Humphrey, male    DOB: 02-10-1956  Age: 68 y.o. MRN: [id number]  Chief Complaint  Patient presents with   Follow-up    6 month follow up    Patient in office for 6 month follow up. Patient doing well, no complaints today.  Recent lab work discussed. LDL improved on rosuvastatin  5 mg daily. Patient reports having myalgias when he first started the statin, they have since resolved.  Did not have A1c or PSA done, Will do today.  Medications refilled.     No other concerns at this time.   Past Medical History:  Diagnosis Date   Atrial fibrillation (HCC)    Diabetes mellitus without complication (HCC)    Hypertension    Obesity (BMI 30-39.9) 02/10/2020    Past Surgical History:  Procedure Laterality Date   NO PAST SURGERIES      Social History   Socioeconomic History   Marital status: Single    Spouse name: Not on file   Number of children: Not on file   Years of education: Not on file   Highest education level: Not on file  Occupational History   Not on file  Tobacco Use   Smoking status: Never   Smokeless tobacco: Never  Substance and Sexual Activity   Alcohol use: Never   Drug use: Never   Sexual activity: Not on file  Other Topics Concern   Not on file  Social History Narrative   Not on file   Social Drivers of Health   Financial Resource Strain: Not on file  Food Insecurity: Not on file  Transportation Needs: Not on file  Physical Activity: Not on file  Stress: Not on file  Social Connections: Not on file  Intimate Partner Violence: Not on file    Family History  Problem Relation Age of Onset   Hypertension Mother    Diabetes Mother    Stroke Mother    Varicose Veins Mother    Heart attack Mother    Hypertension Father    Neuropathy Father     Allergies  Allergen Reactions   Poison Ivy Extract    Poison Counce Extract     Outpatient Medications Prior to Visit  Medication  Sig   apixaban  (ELIQUIS ) 5 MG TABS tablet Take 1 tablet (5 mg total) by mouth 2 (two) times daily.   digoxin  (LANOXIN ) 0.125 MG tablet Take 1 tablet (125 mcg total) by mouth daily.   loratadine  (CLARITIN ) 10 MG tablet Take 1 tablet (10 mg total) by mouth daily. (Patient taking differently: Take 10 mg by mouth as needed for allergies.)   [DISCONTINUED] dapagliflozin  propanediol (FARXIGA ) 10 MG TABS tablet Take 1 tablet (10 mg total) by mouth daily before breakfast.   [DISCONTINUED] doxazosin  (CARDURA ) 4 MG tablet Take 1 tablet (4 mg total) by mouth daily.   [DISCONTINUED] glipiZIDE  (GLUCOTROL  XL) 10 MG 24 hr tablet TAKE 1 TABLET BY MOUTH DAILY   [DISCONTINUED] metoprolol  (TOPROL -XL) 200 MG 24 hr tablet Take 1 tablet (200 mg total) by mouth daily.   [DISCONTINUED] mupirocin  cream (BACTROBAN ) 2 % Apply 1 Application topically 2 (two) times daily.   [DISCONTINUED] olmesartan  (BENICAR ) 40 MG tablet TAKE ONE TABLET BY MOUTH DAILY   [DISCONTINUED] pioglitazone  (ACTOS ) 45 MG tablet TAKE ONE TABLET BY MOUTH DAILY   [DISCONTINUED] rosuvastatin  (CRESTOR ) 5 MG tablet Take 1 tablet (5 mg total) by mouth daily.   No facility-administered medications prior to visit.  Review of Systems  Constitutional: Negative.   HENT: Negative.    Eyes: Negative.   Respiratory: Negative.  Negative for shortness of breath.   Cardiovascular: Negative.  Negative for chest pain.  Gastrointestinal: Negative.  Negative for abdominal pain, constipation and diarrhea.  Genitourinary: Negative.   Musculoskeletal:  Negative for joint pain and myalgias.  Skin: Negative.   Neurological: Negative.  Negative for dizziness and headaches.  Endo/Heme/Allergies: Negative.   All other systems reviewed and are negative.      Objective:   BP 136/84   Pulse 83   Ht 6' 1 (1.854 m)   Wt 215 lb 6.4 oz (97.7 kg)   SpO2 98%   BMI 28.42 kg/m   Vitals:   04/15/24 1015  BP: 136/84  Pulse: 83  Height: 6' 1 (1.854 m)  Weight:  215 lb 6.4 oz (97.7 kg)  SpO2: 98%  BMI (Calculated): 28.42    Physical Exam Nursing note reviewed.  Constitutional:      Appearance: Normal appearance. He is normal weight.  HENT:     Head: Normocephalic and atraumatic.     Nose: Nose normal.     Mouth/Throat:     Mouth: Mucous membranes are moist.     Pharynx: Oropharynx is clear.  Eyes:     Extraocular Movements: Extraocular movements intact.     Conjunctiva/sclera: Conjunctivae normal.     Pupils: Pupils are equal, round, and reactive to light.  Cardiovascular:     Rate and Rhythm: Normal rate and regular rhythm.     Pulses: Normal pulses.     Heart sounds: Normal heart sounds.  Pulmonary:     Effort: Pulmonary effort is normal.     Breath sounds: Normal breath sounds.  Abdominal:     General: Abdomen is flat. Bowel sounds are normal.     Palpations: Abdomen is soft.  Musculoskeletal:        General: Normal range of motion.     Cervical back: Normal range of motion.  Skin:    General: Skin is warm and dry.  Neurological:     General: No focal deficit present.     Mental Status: He is alert and oriented to person, place, and time.  Psychiatric:        Mood and Affect: Mood normal.        Behavior: Behavior normal.        Thought Content: Thought content normal.        Judgment: Judgment normal.      No results found for any visits on 04/15/24.  Recent Results (from the past 2160 hours)  CBC     Status: Abnormal   Collection Time: 03/25/24 10:48 AM  Result Value Ref Range   WBC 7.2 3.4 - 10.8 x10E3/uL   RBC 4.56 4.14 - 5.80 x10E6/uL   Hemoglobin 13.4 13.0 - 17.7 g/dL   Hematocrit 57.7 62.4 - 51.0 %   MCV 93 79 - 97 fL   MCH 29.4 26.6 - 33.0 pg   MCHC 31.8 31.5 - 35.7 g/dL   RDW 86.1 88.3 - 84.5 %   Platelets 148 (L) 150 - 450 x10E3/uL  Comprehensive metabolic panel with GFR     Status: Abnormal   Collection Time: 03/25/24 10:48 AM  Result Value Ref Range   Glucose 242 (H) 70 - 99 mg/dL   BUN 15 8 -  27 mg/dL   Creatinine, Ser 8.62 (H) 0.76 - 1.27 mg/dL   eGFR 57 (L) >  59 mL/min/1.73   BUN/Creatinine Ratio 11 10 - 24   Sodium 140 134 - 144 mmol/L   Potassium 3.9 3.5 - 5.2 mmol/L   Chloride 103 96 - 106 mmol/L   CO2 23 20 - 29 mmol/L   Calcium  9.5 8.6 - 10.2 mg/dL   Total Protein 7.1 6.0 - 8.5 g/dL   Albumin 4.7 3.9 - 4.9 g/dL   Globulin, Total 2.4 1.5 - 4.5 g/dL   Bilirubin Total 1.4 (H) 0.0 - 1.2 mg/dL   Alkaline Phosphatase 95 47 - 123 IU/L    Comment:               **Please note reference interval change**   AST 16 0 - 40 IU/L   ALT 16 0 - 44 IU/L  Digoxin  level     Status: Abnormal   Collection Time: 03/25/24 10:48 AM  Result Value Ref Range   Digoxin , Serum 1.7 (H) 0.5 - 0.9 ng/mL    Comment: Concentrations above 2.0 ng/mL are generally considered toxic. Some overlap of toxic and non-toxic values have been reported.                             Detection Limit = 0.4 ng/mL Therapeutic range is derived from 2013 ACCF/AHA Guidelines for the Management of Heart Failure.   Lipid panel     Status: Abnormal   Collection Time: 03/25/24 10:48 AM  Result Value Ref Range   Cholesterol, Total 140 100 - 199 mg/dL   Triglycerides 876 0 - 149 mg/dL   HDL 38 (L) >60 mg/dL   VLDL Cholesterol Cal 22 5 - 40 mg/dL   LDL Chol Calc (NIH) 80 0 - 99 mg/dL   Chol/HDL Ratio 3.7 0.0 - 5.0 ratio    Comment:                                   T. Chol/HDL Ratio                                             Men  Women                               1/2 Avg.Risk  3.4    3.3                                   Avg.Risk  5.0    4.4                                2X Avg.Risk  9.6    7.1                                3X Avg.Risk 23.4   11.0   Protime-INR     Status: Abnormal   Collection Time: 03/25/24 10:48 AM  Result Value Ref Range   INR 1.4 (H) 0.9 - 1.2    Comment: Reference interval is for non-anticoagulated patients. Suggested INR therapeutic range for Vitamin K antagonist  therapy:     Standard Dose (moderate intensity                   therapeutic range):       2.0 - 3.0    Higher intensity therapeutic range       2.5 - 3.5    Prothrombin Time 15.1 (H) 9.1 - 12.0 sec      Assessment & Plan:  Lab work today Continue same medications  Problem List Items Addressed This Visit       Cardiovascular and Mediastinum   Hypertensive heart disease   Relevant Medications   doxazosin  (CARDURA ) 4 MG tablet   rosuvastatin  (CRESTOR ) 5 MG tablet   metoprolol  (TOPROL -XL) 200 MG 24 hr tablet   olmesartan  (BENICAR ) 40 MG tablet   Atrial fibrillation (HCC)   Relevant Medications   doxazosin  (CARDURA ) 4 MG tablet   rosuvastatin  (CRESTOR ) 5 MG tablet   metoprolol  (TOPROL -XL) 200 MG 24 hr tablet   olmesartan  (BENICAR ) 40 MG tablet   Hypertension - Primary   Relevant Medications   doxazosin  (CARDURA ) 4 MG tablet   rosuvastatin  (CRESTOR ) 5 MG tablet   metoprolol  (TOPROL -XL) 200 MG 24 hr tablet   olmesartan  (BENICAR ) 40 MG tablet   Varicose veins of bilateral lower extremities with other complications   Relevant Medications   doxazosin  (CARDURA ) 4 MG tablet   rosuvastatin  (CRESTOR ) 5 MG tablet   metoprolol  (TOPROL -XL) 200 MG 24 hr tablet   olmesartan  (BENICAR ) 40 MG tablet     Endocrine   Type 2 diabetes mellitus without complications (HCC)   Relevant Medications   dapagliflozin  propanediol (FARXIGA ) 10 MG TABS tablet   glipiZIDE  (GLUCOTROL  XL) 10 MG 24 hr tablet   pioglitazone  (ACTOS ) 45 MG tablet   rosuvastatin  (CRESTOR ) 5 MG tablet   olmesartan  (BENICAR ) 40 MG tablet   Other Relevant Orders   Hemoglobin A1c   Other Visit Diagnoses       Prostate cancer screening       Relevant Orders   PSA     SOB (shortness of breath)       Relevant Medications   rosuvastatin  (CRESTOR ) 5 MG tablet     Other chest pain       stress test showed anterolateral reversible defect with Llvef 53%, ASYMPTOMATIC, does not want cardia cath   Relevant Medications   rosuvastatin   (CRESTOR ) 5 MG tablet     Mixed hyperlipidemia       LDL 170, ADD crestor  5, H AS  been UNABLE TO TOLERATE STATINS, simvistatin   Relevant Medications   doxazosin  (CARDURA ) 4 MG tablet   rosuvastatin  (CRESTOR ) 5 MG tablet   metoprolol  (TOPROL -XL) 200 MG 24 hr tablet   olmesartan  (BENICAR ) 40 MG tablet       Return in about 6 months (around 10/14/2024) for fasting lab work prior.   Total time spent: 25 minutes  [employer], NP  04/15/2024   This document may have been prepared by Dragon Voice Recognition software and as such may include unintentional dictation errors.

## 2024-04-16 ENCOUNTER — Other Ambulatory Visit: Payer: Self-pay | Admitting: Cardiology

## 2024-04-16 ENCOUNTER — Ambulatory Visit: Payer: Self-pay | Admitting: Cardiology

## 2024-04-16 DIAGNOSIS — E1165 Type 2 diabetes mellitus with hyperglycemia: Secondary | ICD-10-CM

## 2024-04-16 DIAGNOSIS — R972 Elevated prostate specific antigen [PSA]: Secondary | ICD-10-CM

## 2024-04-16 LAB — HEMOGLOBIN A1C
Est. average glucose Bld gHb Est-mCnc: 280 mg/dL
Hgb A1c MFr Bld: 11.4 % — ABNORMAL HIGH (ref 4.8–5.6)

## 2024-04-16 LAB — PSA: Prostate Specific Ag, Serum: 4.4 ng/mL — ABNORMAL HIGH (ref 0.0–4.0)

## 2024-04-17 NOTE — Progress Notes (Signed)
   04/17/2024  Patient ID: Jesse Humphrey, male   DOB: 09/14/1955, 68 y.o.   MRN: [id number]  Pharmacy Quality Measure Review  This patient is appearing on a report for being at risk of failing the adherence measure for cholesterol (statin), diabetes, and hypertension (ACEi/ARB) medications this calendar year.   Medication: Rosuvastatin  Last fill date: 04/08/24 for 90 day supply  Medication: Glipizide  Last fill date: 04/08/24 for 90 day supply  Medication: Pioglitazone  Last fill date: 03/19/24 for 90 day supply  Medication: Olmesartan  Last fill date: 03/19/24 for 90 day supply  Insurance report was not up to date. No action needed at this time.   Jon VEAR Lindau, PharmD Clinical Pharmacist [phone]

## 2024-04-30 DIAGNOSIS — Z5181 Encounter for therapeutic drug level monitoring: Secondary | ICD-10-CM | POA: Diagnosis not present

## 2024-04-30 DIAGNOSIS — Z79899 Other long term (current) drug therapy: Secondary | ICD-10-CM | POA: Diagnosis not present

## 2024-05-01 LAB — DIGOXIN LEVEL: Digoxin, Serum: 0.8 ng/mL (ref 0.5–0.9)

## 2024-05-21 DIAGNOSIS — H2513 Age-related nuclear cataract, bilateral: Secondary | ICD-10-CM | POA: Diagnosis not present

## 2024-05-21 DIAGNOSIS — H04129 Dry eye syndrome of unspecified lacrimal gland: Secondary | ICD-10-CM | POA: Diagnosis not present

## 2024-05-21 DIAGNOSIS — H1045 Other chronic allergic conjunctivitis: Secondary | ICD-10-CM | POA: Diagnosis not present

## 2024-05-21 DIAGNOSIS — Z7984 Long term (current) use of oral hypoglycemic drugs: Secondary | ICD-10-CM | POA: Diagnosis not present

## 2024-05-21 DIAGNOSIS — H40023 Open angle with borderline findings, high risk, bilateral: Secondary | ICD-10-CM | POA: Diagnosis not present

## 2024-05-21 DIAGNOSIS — E119 Type 2 diabetes mellitus without complications: Secondary | ICD-10-CM | POA: Diagnosis not present

## 2024-07-28 NOTE — Progress Notes (Unsigned)
 "  Cardiology Office Note    Date:  07/29/2024   ID:  Jesse Humphrey, DOB 1955-11-07, MRN [id number]  PCP:  Carin Gauze, NP  Cardiologist:  Deatrice Cage, MD  Electrophysiologist:  None   Chief Complaint: Follow up  History of Present Illness:   Jesse Humphrey is a 69 y.o. male with history of longstanding persistent A-fib, DM2, HTN, HLD, and obesity who presents for follow-up of A-fib.  He was previously followed by Drs. Masoud and [facility].  He has history of A-fib that was diagnosed in 2020, with rate control strategy pursued and has been anticoagulated with warfarin with intermittent monitoring of INR.  He established care with Dr. Cage in 03/2024.  Prior to establishing care with our office, he underwent echo in 10/2023 that showed normal LV systolic function, mild LVH, mild mitral regurgitation, and mild pulmonary hypertension.  Lexiscan MPI in 10/2023 showed possible anterior wall ischemia with an EF of 53%.  LHC was suggested, though declined by patient given lack of symptoms.  It was suspected the stress test was a false positive.  Regarding his A-fib, digoxin  was decreased to 0.125 mg daily with consideration to discontinue the medication altogether down the road.  He was transitioned from warfarin to apixaban .  He comes in doing well from a cardiac perspective and is without symptoms of angina or cardiac decompensation.  He reports a 2 to 3-week history of epigastric discomfort that was reproducible to palpation and resolved with large BM several days ago.  He has been without symptoms of epigastric discomfort since.  Never with frank chest pain.  No dyspnea.  He does occasionally note palpitations.  Also notes chronic fatigue that he feels like may have gotten a little worse following addition of apixaban  in place of warfarin and following decreasing digoxin  at last visit.  No significant lower extremity swelling or progressive orthopnea.  No falls, hematochezia, melena, hematuria,  epidermidis, or hemoptysis.  Lastly, he would like to entertain the idea of possibly pursuing rhythm control strategy for A-fib.   Labs independently reviewed: 04/2024 - digoxin  0.8, A1c 11.4 03/2024 - INR 1.4, TC 140, TG 123, HDL 38, LDL 80, BUN 15, serum creatinine 1.37, potassium 3.9, BUN 4.7, AST/ALT normal, Hgb 13.4, PLT 148 10/2023 - TSH normal  Past Medical History:  Diagnosis Date   Atrial fibrillation (HCC)    Diabetes mellitus without complication (HCC)    Hypertension    Obesity (BMI 30-39.9) 02/10/2020    Past Surgical History:  Procedure Laterality Date   NO PAST SURGERIES      Current Medications: Active Medications[1]  Allergies:   Poison ivy extract and Poison oak extract   Social History   Socioeconomic History   Marital status: Single    Spouse name: Not on file   Number of children: Not on file   Years of education: Not on file   Highest education level: Not on file  Occupational History   Not on file  Tobacco Use   Smoking status: Never   Smokeless tobacco: Never  Substance and Sexual Activity   Alcohol use: Never   Drug use: Never   Sexual activity: Not on file  Other Topics Concern   Not on file  Social History Narrative   Not on file   Social Drivers of Health   Tobacco Use: Low Risk (07/29/2024)   Patient History    Smoking Tobacco Use: Never    Smokeless Tobacco Use: Never    Passive Exposure: Not  on file  Financial Resource Strain: Not on file  Food Insecurity: Not on file  Transportation Needs: Not on file  Physical Activity: Not on file  Stress: Not on file  Social Connections: Not on file  Depression (PHQ2-9): Low Risk (10/15/2023)   Depression (PHQ2-9)    PHQ-2 Score: 0  Alcohol Screen: Not on file  Housing: Not on file  Utilities: Not on file  Health Literacy: Not on file     Family History:  The patient's family history includes Diabetes in his mother; Heart attack in his mother; Hypertension in his father and mother;  Neuropathy in his father; Stroke in his mother; Varicose Veins in his mother.  ROS:   12-point review of systems is negative unless otherwise noted in the HPI.   EKGs/Labs/Other Studies Reviewed:    Studies reviewed were summarized above. The additional studies were reviewed today: As above.  EKG:  EKG is ordered today.  The EKG ordered today demonstrates Afib, 94 bpm, nonspecific st/t changes   Recent Labs: 10/21/2023: TSH 3.100 03/25/2024: ALT 16; BUN 15; Creatinine, Ser 1.37; Hemoglobin 13.4; Platelets 148; Potassium 3.9; Sodium 140  Recent Lipid Panel    Component Value Date/Time   CHOL 140 03/25/2024 1048   TRIG 123 03/25/2024 1048   HDL 38 (L) 03/25/2024 1048   CHOLHDL 3.7 03/25/2024 1048   CHOLHDL 6.3 (H) 07/20/2020 1219   LDLCALC 80 03/25/2024 1048   LDLCALC 161 (H) 07/20/2020 1219    PHYSICAL EXAM:    VS:  BP 130/70 (BP Location: Left Arm, Patient Position: Sitting, Cuff Size: Normal)   Pulse 94   Ht 6' 1 (1.854 m)   Wt 209 lb (94.8 kg)   SpO2 96%   BMI 27.57 kg/m   BMI: Body mass index is 27.57 kg/m.  Physical Exam Vitals reviewed.  Constitutional:      Appearance: He is well-developed.  HENT:     Head: Normocephalic and atraumatic.  Eyes:     General:        Right eye: No discharge.        Left eye: No discharge.  Cardiovascular:     Rate and Rhythm: Normal rate. Rhythm irregularly irregular.     Heart sounds: Normal heart sounds, S1 normal and S2 normal. Heart sounds not distant. No midsystolic click and no opening snap. No murmur heard.    No friction rub.  Pulmonary:     Effort: Pulmonary effort is normal. No respiratory distress.     Breath sounds: Normal breath sounds. No decreased breath sounds, wheezing, rhonchi or rales.  Musculoskeletal:     Cervical back: Normal range of motion.     Right lower leg: No edema.     Left lower leg: No edema.  Skin:    General: Skin is warm and dry.     Nails: There is no clubbing.  Neurological:      Mental Status: He is alert and oriented to person, place, and time.  Psychiatric:        Speech: Speech normal.        Behavior: Behavior normal.        Thought Content: Thought content normal.        Judgment: Judgment normal.     Wt Readings from Last 3 Encounters:  07/29/24 209 lb (94.8 kg)  04/15/24 215 lb 6.4 oz (97.7 kg)  03/25/24 218 lb 6 oz (99.1 kg)     ASSESSMENT & PLAN:   Longstanding persistent A-fib:  Ventricular rates reasonably controlled in the office today and at home with rates largely in the mid 60s to mid 90s bpm.  He reports a longstanding history of fatigue which is likely multifactorial including polypharmacy and cannot exclude A-fib contributing as well.  We will undergo a trial of discontinuing digoxin  with close monitoring of ventricular rates.  He remains on Toprol -XL 200 mg daily for now.  May need to consider addition of nondihydropyridine calcium  channel blocker for added ventricular rate control off digoxin .  We will also update an echo to evaluate for significant structural abnormality and measurement of left atrial dimension for consideration of possible DCCV to see if restoration of normal rhythm improves his fatigue.  Check CBC and BMP.  CHADS2VASc at least 3 (HTN, age x 1, DM).  Remains on apixaban  5 mg twice daily and does not meet reduced dosing criteria.  No falls or symptoms concerning for bleeding.  HTN: Blood pressure is well-controlled in the office today.  Remains on Cardura  4 mg daily, Toprol -XL 200 mg daily, and olmesartan  40 mg daily.  Again, polypharmacy may be contributing to longstanding fatigue.  HLD: LDL 80 in 03/2024.  Remains on rosuvastatin  5 mg.  Mildly abnormal nuclear stress test: Felt to be falsely positive.  Symptoms of epigastric discomfort are not consistent with angina and are reproducible to palpation.  Pain also improved with large BM.  If symptoms return, consider further ischemic testing.  Will defer further testing for now given  atypical (epigastric and reproducible to palpation) and resolved symptoms.    Disposition: F/u with Dr. Darron or an APP in 2 months.   Medication Adjustments/Labs and Tests Ordered: Current medicines are reviewed at length with the patient today.  Concerns regarding medicines are outlined above. Medication changes, Labs and Tests ordered today are summarized above and listed in the Patient Instructions accessible in Encounters.   Signed, Bernardino Bring, PA-C 07/29/2024 1:00 PM     [facility] [place] [address] [place], [place] [address] [phone]     [1]  Current Meds  Medication Sig   apixaban  (ELIQUIS ) 5 MG TABS tablet Take 1 tablet (5 mg total) by mouth 2 (two) times daily.   dapagliflozin  propanediol (FARXIGA ) 10 MG TABS tablet Take 1 tablet (10 mg total) by mouth daily before breakfast.   doxazosin  (CARDURA ) 4 MG tablet Take 1 tablet (4 mg total) by mouth daily.   glipiZIDE  (GLUCOTROL  XL) 10 MG 24 hr tablet Take 1 tablet (10 mg total) by mouth daily.   loratadine  (CLARITIN ) 10 MG tablet Take 1 tablet (10 mg total) by mouth daily. (Patient taking differently: Take 10 mg by mouth as needed for allergies.)   metoprolol  (TOPROL -XL) 200 MG 24 hr tablet Take 1 tablet (200 mg total) by mouth daily.   mupirocin  cream (BACTROBAN ) 2 % Apply 1 Application topically 2 (two) times daily.   olmesartan  (BENICAR ) 40 MG tablet Take 1 tablet (40 mg total) by mouth daily.   pioglitazone  (ACTOS ) 45 MG tablet Take 1 tablet (45 mg total) by mouth daily.   rosuvastatin  (CRESTOR ) 5 MG tablet Take 1 tablet (5 mg total) by mouth daily.   [DISCONTINUED] digoxin  (LANOXIN ) 0.125 MG tablet Take 1 tablet (125 mcg total) by mouth daily.   "

## 2024-07-29 ENCOUNTER — Ambulatory Visit: Attending: Physician Assistant | Admitting: Physician Assistant

## 2024-07-29 ENCOUNTER — Encounter: Payer: Self-pay | Admitting: Physician Assistant

## 2024-07-29 VITALS — BP 130/70 | HR 94 | Ht 73.0 in | Wt 209.0 lb

## 2024-07-29 DIAGNOSIS — Z79899 Other long term (current) drug therapy: Secondary | ICD-10-CM | POA: Diagnosis not present

## 2024-07-29 DIAGNOSIS — E785 Hyperlipidemia, unspecified: Secondary | ICD-10-CM

## 2024-07-29 DIAGNOSIS — I4811 Longstanding persistent atrial fibrillation: Secondary | ICD-10-CM | POA: Diagnosis not present

## 2024-07-29 DIAGNOSIS — I10 Essential (primary) hypertension: Secondary | ICD-10-CM | POA: Diagnosis not present

## 2024-07-29 DIAGNOSIS — I4821 Permanent atrial fibrillation: Secondary | ICD-10-CM

## 2024-07-29 NOTE — Patient Instructions (Signed)
 Medication Instructions:  Your physician recommends the following medication changes.  STOP TAKING: Digoxin   *If you need a refill on your cardiac medications before your next appointment, please call your pharmacy*  Lab Work: Your provider would like for you to have following labs drawn today BMeT and  CBC.   If you have labs (blood work) drawn today and your tests are completely normal, you will receive your results only by: MyChart Message (if you have MyChart) OR A paper copy in the mail If you have any lab test that is abnormal or we need to change your treatment, we will call you to review the results.  You may go to one of the following [facility] offices for possible bill pay:  [address] [place], [place] [address] Phone: [phone] Lab hours: Mon-Fri 8 am- 5 pm    Lunch 12 pm- 1 pm  [address] [place],  [place]  [address]  [place]  Phone: [phone] Lab hours: 7 am- 4 pm Lunch 12 pm-1 pm   [address] [place],  [place]  [address]  [place]  Phone: [phone] Lab hours: Mon-Fri 8 am- 5 pm    Lunch 12 pm- 1 pm   Testing/Procedures: Your physician has requested that you have an echocardiogram. Echocardiography is a painless test that uses sound waves to create images of your heart. It provides your doctor with information about the size and shape of your heart and how well your hearts chambers and valves are working.   You may receive an ultrasound enhancing agent through an IV if needed to better visualize your heart during the echo. This procedure takes approximately one hour.  There are no restrictions for this procedure.  This will take place at [address] [facility]) [address], [place] [address]  Please note: We ask at that you not bring children with you during ultrasound (echo/ vascular) testing. Due to room size and safety concerns, children are not allowed in the ultrasound rooms during exams. Our front office staff cannot provide observation of  children in our lobby area while testing is being conducted. An adult accompanying a patient to their appointment will only be allowed in the ultrasound room at the discretion of the ultrasound technician under special circumstances. We apologize for any inconvenience.   Follow-Up: At [facility], you and your health needs are our priority.  As part of our continuing mission to provide you with exceptional heart care, our providers are all part of one team.  This team includes your primary Cardiologist (physician) and Advanced Practice Providers or APPs (Physician Assistants and Nurse Practitioners) who all work together to provide you with the care you need, when you need it.  Your next appointment:   2 month(s)  Provider:   You may see Deatrice Cage, MD or Bernardino Bring, PA-C  We recommend signing up for the patient portal called MyChart.  Sign up information is provided on this After Visit Summary.  MyChart is used to connect with patients for Virtual Visits (Telemedicine).  Patients are able to view lab/test results, encounter notes, upcoming appointments, etc.  Non-urgent messages can be sent to your provider as well.   To learn more about what you can do with MyChart, go to [email or web address].

## 2024-07-30 ENCOUNTER — Ambulatory Visit: Payer: Self-pay | Admitting: Physician Assistant

## 2024-07-30 ENCOUNTER — Other Ambulatory Visit: Payer: Self-pay | Admitting: Cardiology

## 2024-07-30 DIAGNOSIS — E119 Type 2 diabetes mellitus without complications: Secondary | ICD-10-CM

## 2024-07-30 LAB — BASIC METABOLIC PANEL WITH GFR
BUN/Creatinine Ratio: 16 (ref 10–24)
BUN: 19 mg/dL (ref 8–27)
CO2: 19 mmol/L — ABNORMAL LOW (ref 20–29)
Calcium: 10 mg/dL (ref 8.6–10.2)
Chloride: 103 mmol/L (ref 96–106)
Creatinine, Ser: 1.19 mg/dL (ref 0.76–1.27)
Glucose: 302 mg/dL — ABNORMAL HIGH (ref 70–99)
Potassium: 4.2 mmol/L (ref 3.5–5.2)
Sodium: 141 mmol/L (ref 134–144)
eGFR: 67 mL/min/{1.73_m2}

## 2024-07-30 LAB — CBC
Hematocrit: 43.9 % (ref 37.5–51.0)
Hemoglobin: 14.3 g/dL (ref 13.0–17.7)
MCH: 29.6 pg (ref 26.6–33.0)
MCHC: 32.6 g/dL (ref 31.5–35.7)
MCV: 91 fL (ref 79–97)
Platelets: 183 10*3/uL (ref 150–450)
RBC: 4.83 x10E6/uL (ref 4.14–5.80)
RDW: 13.4 % (ref 11.6–15.4)
WBC: 7.4 10*3/uL (ref 3.4–10.8)

## 2024-08-10 ENCOUNTER — Ambulatory Visit

## 2024-09-01 ENCOUNTER — Ambulatory Visit (INDEPENDENT_AMBULATORY_CARE_PROVIDER_SITE_OTHER): Admitting: Vascular Surgery

## 2024-10-07 ENCOUNTER — Ambulatory Visit: Admitting: Physician Assistant

## 2024-10-14 ENCOUNTER — Ambulatory Visit: Admitting: Cardiology
